# Patient Record
Sex: Female | Born: 1955 | Race: Black or African American | Hispanic: No | Marital: Single | Smoking: Never smoker
Health system: Southern US, Community
[De-identification: ages and names within clinical notes are randomized; demographics above are authoritative.]

## PROBLEM LIST (undated history)

## (undated) DIAGNOSIS — B009 Herpesviral infection, unspecified: Secondary | ICD-10-CM

## (undated) DIAGNOSIS — D649 Anemia, unspecified: Secondary | ICD-10-CM

## (undated) HISTORY — DX: Anemia, unspecified: D64.9

## (undated) HISTORY — PX: NECK SURGERY: SHX720

## (undated) HISTORY — DX: Herpesviral infection, unspecified: B00.9

---

## 1997-04-02 ENCOUNTER — Ambulatory Visit (HOSPITAL_COMMUNITY): Admission: RE | Admit: 1997-04-02 | Discharge: 1997-04-02 | Payer: Self-pay | Admitting: Family Medicine

## 1998-04-23 ENCOUNTER — Ambulatory Visit (HOSPITAL_COMMUNITY): Admission: RE | Admit: 1998-04-23 | Discharge: 1998-04-23 | Payer: Self-pay | Admitting: Family Medicine

## 1998-05-09 ENCOUNTER — Encounter: Payer: Self-pay | Admitting: Family Medicine

## 1998-05-09 ENCOUNTER — Ambulatory Visit (HOSPITAL_COMMUNITY): Admission: RE | Admit: 1998-05-09 | Discharge: 1998-05-09 | Payer: Self-pay | Admitting: Family Medicine

## 1999-02-06 ENCOUNTER — Other Ambulatory Visit: Admission: RE | Admit: 1999-02-06 | Discharge: 1999-02-06 | Payer: Self-pay | Admitting: Family Medicine

## 1999-02-18 ENCOUNTER — Ambulatory Visit (HOSPITAL_COMMUNITY): Admission: RE | Admit: 1999-02-18 | Discharge: 1999-02-18 | Payer: Self-pay | Admitting: Family Medicine

## 1999-02-18 ENCOUNTER — Encounter: Payer: Self-pay | Admitting: Family Medicine

## 1999-04-02 ENCOUNTER — Ambulatory Visit (HOSPITAL_COMMUNITY): Admission: RE | Admit: 1999-04-02 | Discharge: 1999-04-02 | Payer: Self-pay | Admitting: Orthopedic Surgery

## 1999-11-27 ENCOUNTER — Ambulatory Visit (HOSPITAL_COMMUNITY): Admission: RE | Admit: 1999-11-27 | Discharge: 1999-11-27 | Payer: Self-pay | Admitting: Gastroenterology

## 2000-01-26 ENCOUNTER — Other Ambulatory Visit: Admission: RE | Admit: 2000-01-26 | Discharge: 2000-01-26 | Payer: Self-pay | Admitting: Family Medicine

## 2000-02-19 ENCOUNTER — Encounter: Payer: Self-pay | Admitting: Family Medicine

## 2000-02-19 ENCOUNTER — Ambulatory Visit (HOSPITAL_COMMUNITY): Admission: RE | Admit: 2000-02-19 | Discharge: 2000-02-19 | Payer: Self-pay | Admitting: Family Medicine

## 2001-02-21 ENCOUNTER — Encounter: Payer: Self-pay | Admitting: Family Medicine

## 2001-02-21 ENCOUNTER — Ambulatory Visit (HOSPITAL_COMMUNITY): Admission: RE | Admit: 2001-02-21 | Discharge: 2001-02-21 | Payer: Self-pay | Admitting: Family Medicine

## 2001-03-21 ENCOUNTER — Other Ambulatory Visit: Admission: RE | Admit: 2001-03-21 | Discharge: 2001-03-21 | Payer: Self-pay | Admitting: Family Medicine

## 2001-09-05 ENCOUNTER — Encounter: Admission: RE | Admit: 2001-09-05 | Discharge: 2001-09-05 | Payer: Self-pay | Admitting: Family Medicine

## 2001-09-05 ENCOUNTER — Encounter: Payer: Self-pay | Admitting: Family Medicine

## 2002-02-01 ENCOUNTER — Other Ambulatory Visit: Admission: RE | Admit: 2002-02-01 | Discharge: 2002-02-01 | Payer: Self-pay | Admitting: Family Medicine

## 2002-02-28 ENCOUNTER — Encounter: Admission: RE | Admit: 2002-02-28 | Discharge: 2002-02-28 | Payer: Self-pay | Admitting: Family Medicine

## 2002-02-28 ENCOUNTER — Encounter: Payer: Self-pay | Admitting: Family Medicine

## 2005-10-22 ENCOUNTER — Encounter: Admission: RE | Admit: 2005-10-22 | Discharge: 2005-10-22 | Payer: Self-pay | Admitting: Family Medicine

## 2005-12-24 ENCOUNTER — Ambulatory Visit (HOSPITAL_COMMUNITY): Admission: RE | Admit: 2005-12-24 | Discharge: 2005-12-24 | Payer: Self-pay | Admitting: Gastroenterology

## 2006-01-28 ENCOUNTER — Other Ambulatory Visit: Admission: RE | Admit: 2006-01-28 | Discharge: 2006-01-28 | Payer: Self-pay | Admitting: Family Medicine

## 2006-06-30 ENCOUNTER — Encounter: Admission: RE | Admit: 2006-06-30 | Discharge: 2006-06-30 | Payer: Self-pay | Admitting: Family Medicine

## 2006-10-28 ENCOUNTER — Encounter: Admission: RE | Admit: 2006-10-28 | Discharge: 2006-10-28 | Payer: Self-pay | Admitting: Family Medicine

## 2007-03-03 ENCOUNTER — Other Ambulatory Visit: Admission: RE | Admit: 2007-03-03 | Discharge: 2007-03-03 | Payer: Self-pay | Admitting: Family Medicine

## 2008-01-05 ENCOUNTER — Encounter: Admission: RE | Admit: 2008-01-05 | Discharge: 2008-01-05 | Payer: Self-pay | Admitting: Family Medicine

## 2008-02-09 ENCOUNTER — Encounter: Admission: RE | Admit: 2008-02-09 | Discharge: 2008-02-09 | Payer: Self-pay | Admitting: Obstetrics and Gynecology

## 2008-03-05 ENCOUNTER — Encounter: Admission: RE | Admit: 2008-03-05 | Discharge: 2008-03-05 | Payer: Self-pay | Admitting: Obstetrics and Gynecology

## 2009-01-17 ENCOUNTER — Encounter: Admission: RE | Admit: 2009-01-17 | Discharge: 2009-01-17 | Payer: Self-pay | Admitting: Family Medicine

## 2009-01-31 ENCOUNTER — Ambulatory Visit (HOSPITAL_COMMUNITY): Admission: RE | Admit: 2009-01-31 | Discharge: 2009-02-01 | Payer: Self-pay | Admitting: Neurosurgery

## 2010-01-19 ENCOUNTER — Encounter: Admission: RE | Admit: 2010-01-19 | Discharge: 2010-01-19 | Payer: Self-pay | Admitting: Family Medicine

## 2010-03-09 ENCOUNTER — Encounter
Admission: RE | Admit: 2010-03-09 | Discharge: 2010-03-09 | Payer: Self-pay | Source: Home / Self Care | Attending: Neurosurgery | Admitting: Neurosurgery

## 2010-05-26 LAB — BASIC METABOLIC PANEL
CO2: 25 mEq/L (ref 19–32)
Chloride: 105 mEq/L (ref 96–112)
Creatinine, Ser: 0.7 mg/dL (ref 0.4–1.2)
GFR calc Af Amer: >60 mL/min (ref >60)
Sodium: 135 mEq/L (ref 135–145)

## 2010-05-26 LAB — CBC
Hemoglobin: 13.1 g/dL (ref 12.0–15.0)
MCHC: 34.5 g/dL (ref 30.0–36.0)
MCV: 90.3 fL (ref 78.0–100.0)
RBC: 4.21 MIL/uL (ref 3.87–5.11)

## 2010-07-03 ENCOUNTER — Ambulatory Visit (HOSPITAL_COMMUNITY): Admission: RE | Admit: 2010-07-03 | Payer: 59 | Source: Ambulatory Visit | Admitting: Obstetrics and Gynecology

## 2010-07-10 NOTE — Op Note (Signed)
NAMESHERRICA, NIEHAUS           ACCOUNT NO.:  [id number]   MEDICAL RECORD NO.:  [id number]          PATIENT TYPE:  AMB   LOCATION:  ENDO                         FACILITY:  [facility]   PHYSICIAN:  Anselmo Rod, M.D.  DATE OF BIRTH:  1955-03-09   DATE OF PROCEDURE:  12/24/2005  DATE OF DISCHARGE:                                 OPERATIVE REPORT   PROCEDURE PERFORMED:  Screening colonoscopy.   ENDOSCOPIST:  Anselmo Rod, M.D.   INSTRUMENT USED:  Olympus video colonoscope.   INDICATIONS FOR PROCEDURE:  A 55 year old African-American female undergoing  screening colonoscopy.  Rule out colonic polyps, masses, etc.   PREPROCEDURE PREPARATION:  Informed consent was procured from the patient.  The patient fasted for 4 hr prior to the procedure, and prepped with 20  Osmoprep pills the night before the procedure and 12 pills the morning of  the procedure.  The risks and benefits of the procedure, including a 10%  miss rate of cancer and polyps were discussed with the patient as well.   PREPROCEDURE PHYSICAL:  VITAL SIGNS:  The patient had stable vital signs.  NECK:  Supple.  CHEST:  Clear to auscultation.  HEART:  S1, S2 regular.  ABDOMEN:  Soft with normal bowel sounds.   DESCRIPTION OF PROCEDURE:  The patient was placed in the left lateral  decubitus position, sedated with 70 mcg of fentanyl and 5 mg of Versed given  intravenously in slow incremental doses.  Once the patient was adequately  sedated and maintained on low-flow oxygen and continuous cardiac monitoring,  the Olympus video colonoscope was advanced from the rectum to the cecum. The  appendiceal orifice and ileocecal valve were clearly visualized and  photographed.  The terminal ileum appeared normal without lesions.  No  masses, polyps, erosions, ulcerations or diverticula were seen. Retroflexion  of the rectum revealed no abnormalities.  The patient tolerated the  procedure well, without any complications.   IMPRESSION:  Normal colonoscopy up to terminal ileum.  No masses, polyps,  erosions, ulcerations or diverticula seen.   RECOMMENDATIONS:  Considering the patient's family history of colon cancer  in a first-degree relative, a repeat colonoscopy is recommended in the next  5 years.  If the patient develops any abnormal symptoms in the interim, in  that case she should contact the office immediately for further  recommendations. Outpatient follow-up as need arises in the future.      Anselmo Rod, M.D.  Electronically Signed     JNM/MEDQ  D:  12/24/2005  T:  12/24/2005  Job:  [id number]   cc:   Renaye Rakers, M.D.

## 2010-07-10 NOTE — Procedures (Signed)
[facility]  Patient:    Cristina Bailey, Cristina Bailey                  MRN: [id number] Proc. Date: 11/27/99 Adm. Date:  [id number] Disc. Date: [id number] Attending:  Charna Elizabeth CC:         Geraldo Pitter, M.D.   Procedure Report  DATE OF BIRTH:  1956-01-01  REFERRING PHYSICIAN:  Geraldo Pitter, M.D.  PROCEDURE PERFORMED:  Colonoscopy.  ENDOSCOPIST:  Nicanor Alcon, M.D.  INSTRUMENT USED:  Olympus video colonoscope.  INDICATION FOR PROCEDURE:  Personal history of rectal bleeding and a family history of colon cancer in a brother at age 16 with metastatic disease.  Rule out colonic polyps, masses, hemorrhoids, etc.  PREPROCEDURE PREPARATION:  Informed consent was procured from the patient. The patient was fasted for eight hours prior to the procedure and prepped with a bottle of Magnesium Citrate and a gallon of NuLYTELY the night prior to the procedure.  PREPROCEDURE PHYSICAL:  VITAL SIGNS:  The patient had stable vital signs.  NECK:  Supple.  CHEST:  Clear to auscultation.  HEART:  S1 and S2 regular.  ABDOMEN:  Soft with normal abdominal bowel sounds.  DESCRIPTION OF PROCEDURE:  The patient was placed in the left lateral decubitus position and sedated with Demerol and Versed.  Once the patient was adequately sedated with 50 mg of Demerol and 5 mg of Versed, the Olympus video colonoscope was advanced from the rectum to the cecum without difficulty. There was a small amount of rectal stool in the colon, but visualization was adequate.  No masses, polyps, ulcerations or diverticula was seen.  The patient tolerated the procedure well without complications.  IMPRESSION:  Normal colonoscopy.  RECOMMENDATIONS: 1. I suspect the patients bleeding may be secondary to    constipation and hard stool.  A high fiber diet has been    discussed with her and brochures have been given for education. 2. If her rectal bleeding continues, other  workup in the form of    an esophagogastroduodenoscopy and possible small-bowel study    may be required. 3. If her symptoms remain stable, repeat colorectal cancer    screening is recommended in the next five years unless she    develops any symptoms in the interim.   DD:  11/27/99 TD:  11/30/99 Job: [id number]

## 2010-08-24 IMAGING — US US PELVIS COMPLETE
1 series · 13 of 25 positions shown · non-contrast
Comparison: None.

CLINICAL DATA: Vaginal bleeding.  Uterine fibroids.

TRANSABDOMINAL AND TRANSVAGINAL ULTRASOUND OF PELVIS
TECHNIQUE: Both transabdominal and transvaginal ultrasound
examinations of the pelvis were performed including evaluation of
the uterus, ovaries, adnexal regions, and pelvic cul-de-sac.

[Series 1: us pelvis complete · 0.31mm/px · 13 of 69 slices shown]
[im 1/69]
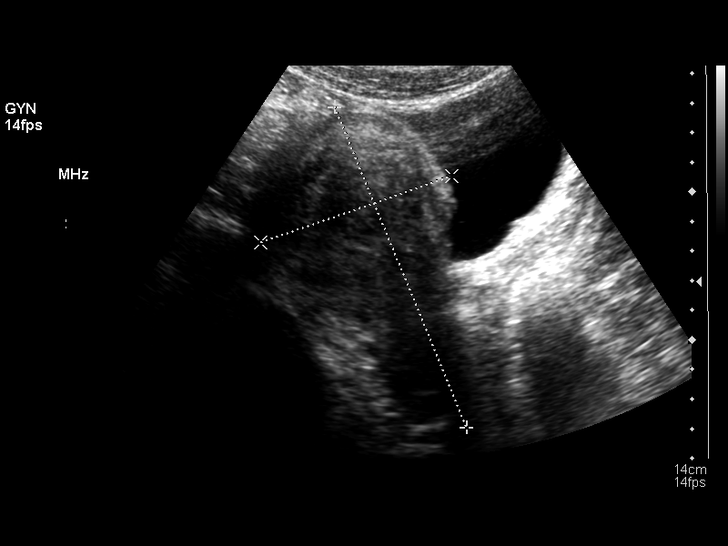
[im 6/69]
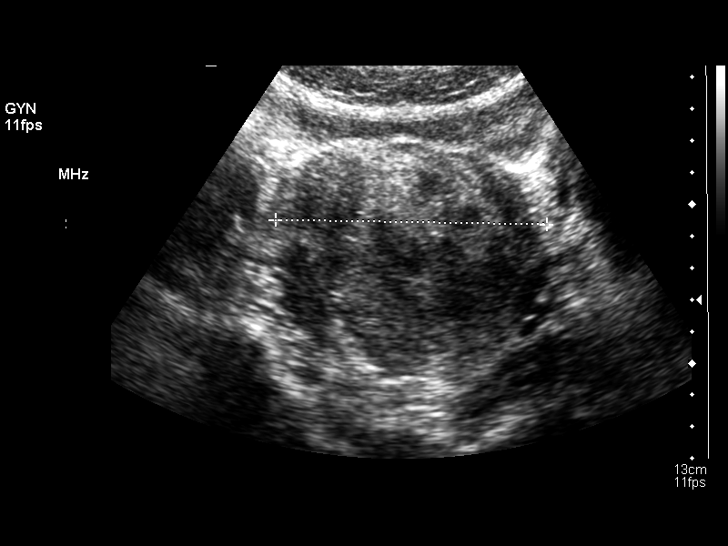
[im 12/69]
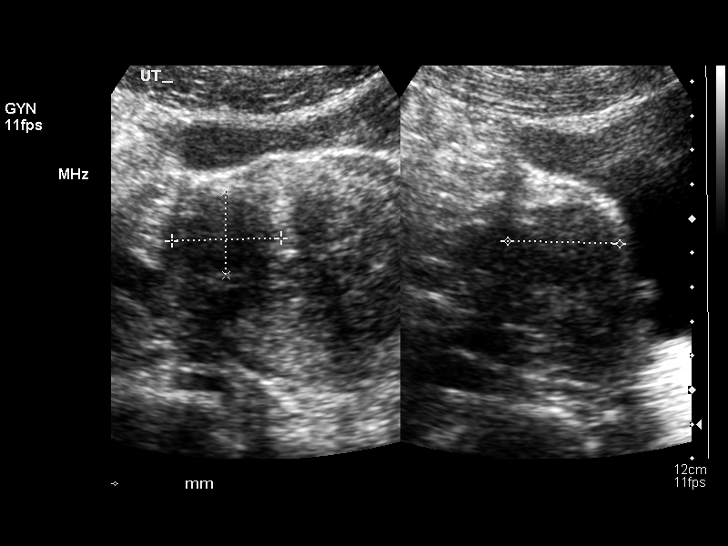
[im 18/69]
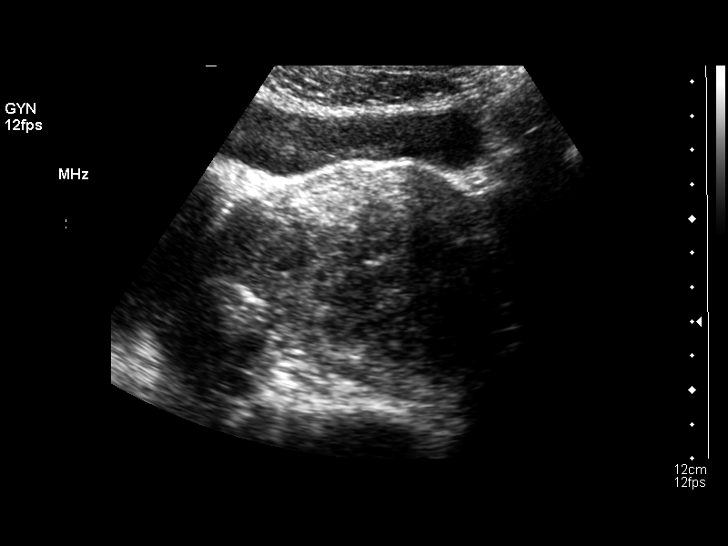
[im 23/69]
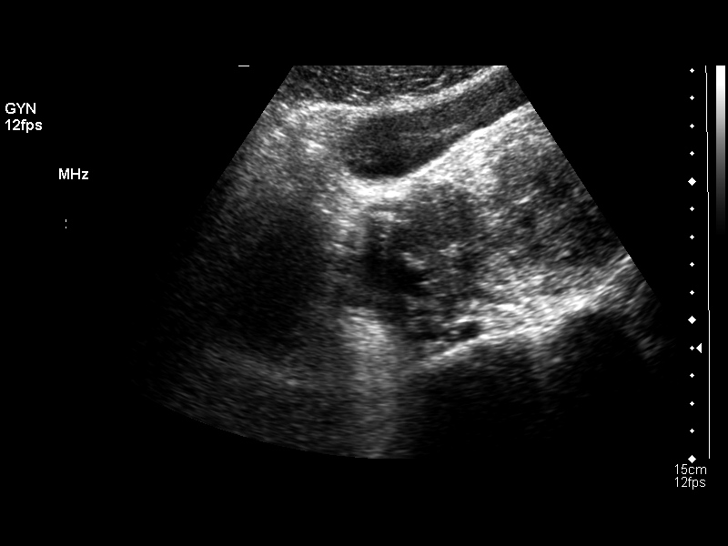
[im 29/69]
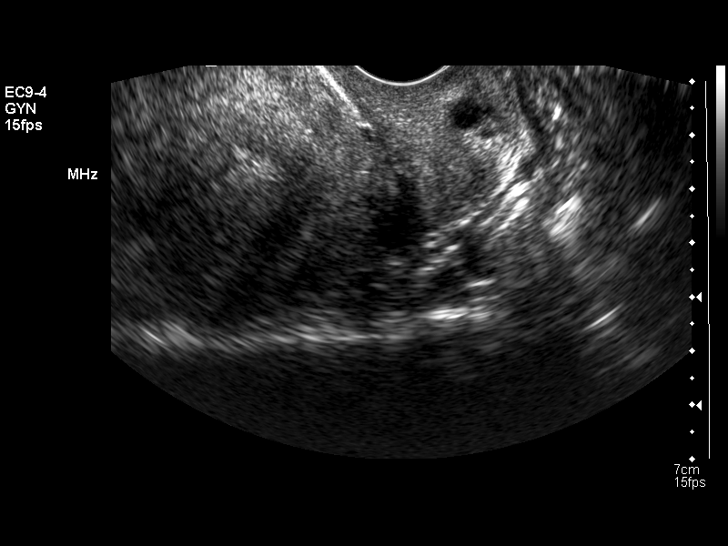
[im 35/69]
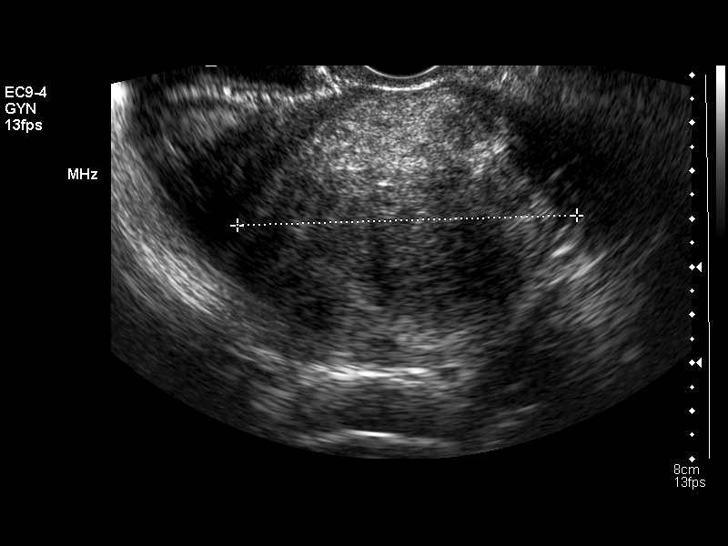
[im 40/69]
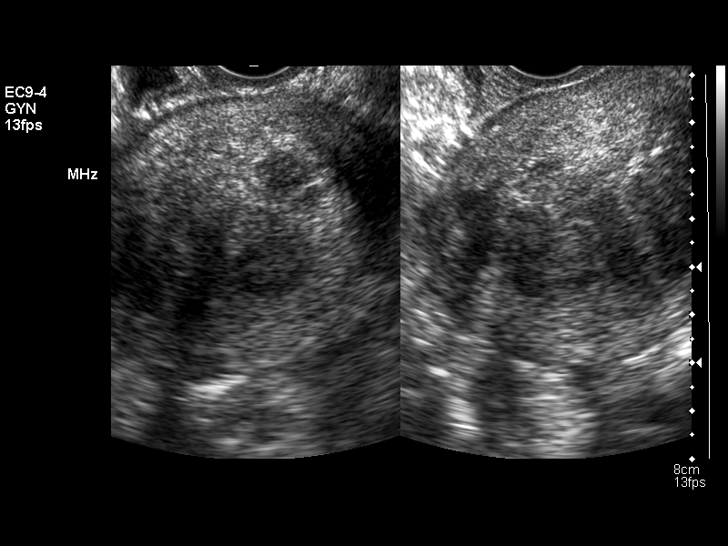
[im 46/69]
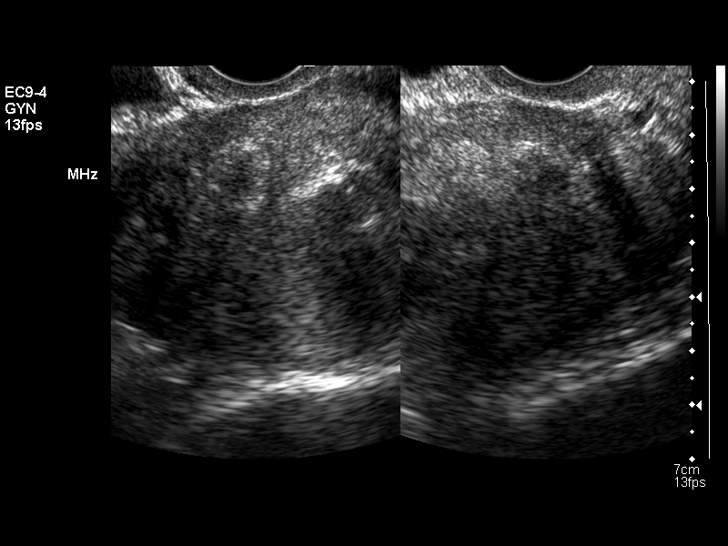
[im 52/69]
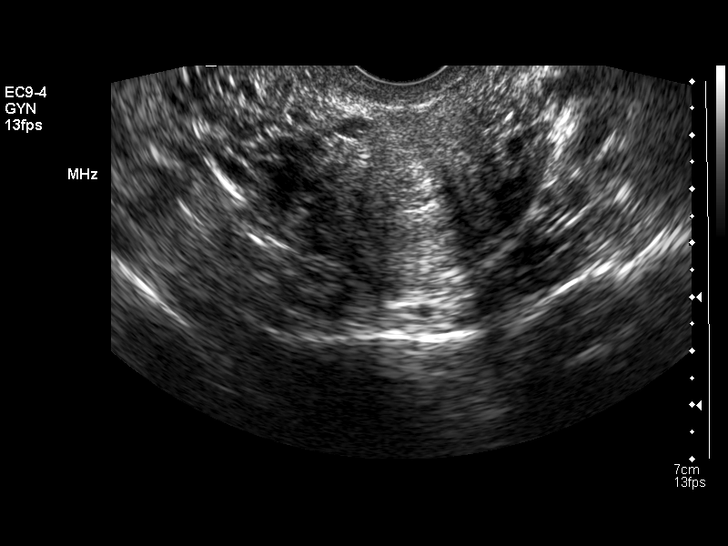
[im 57/69]
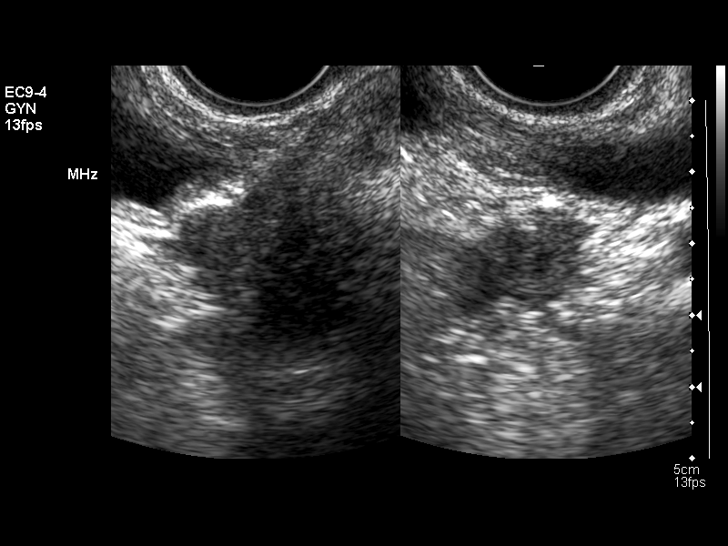
[im 63/69]
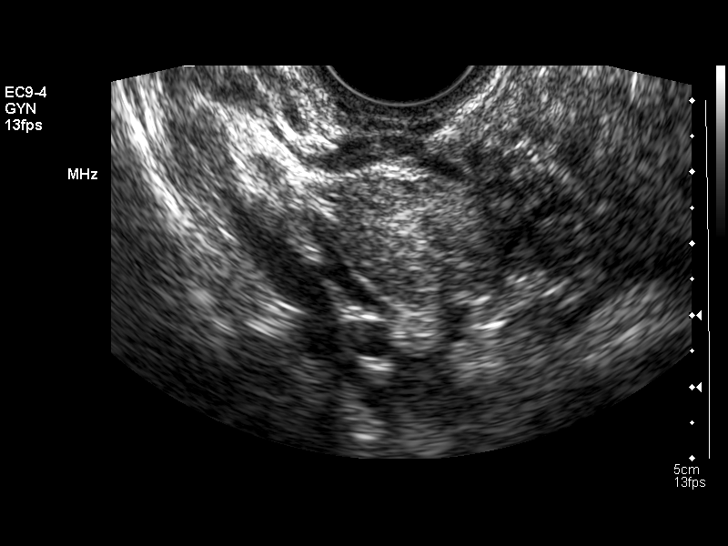
[im 69/69]
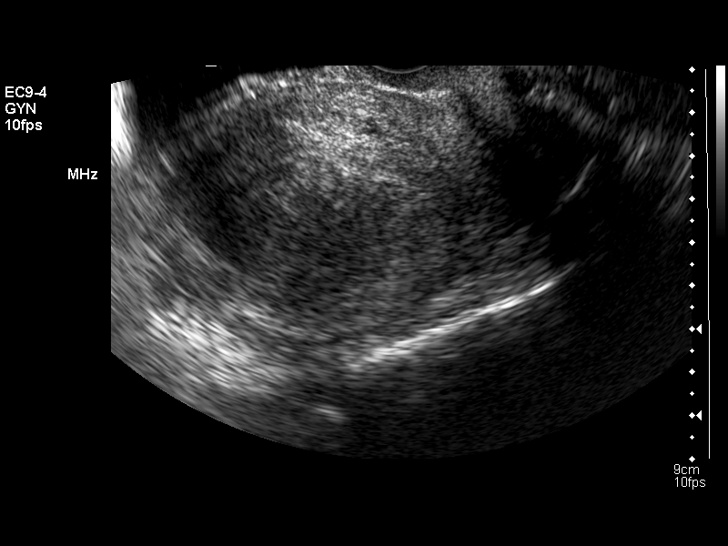

[13 of 25 positions shown; findings below may reference images not displayed]

FINDINGS: Enlarged uterus measuring enlarged uterus measuring
x 6.3 x 7.1 cm.  There are multiple fibroids.  The largest measures
3.4 cm located within the right lateral fundal region.  Other
fibroids  extend to the endometrial lining and may contribute to
the patient's vaginal bleeding.  Distortion of the endometrial
lining.  Endometrial lining thickness 5.4 mm.  No discrete
endometrial polyps or masses identified.  Nabothian cysts.

Nonvisualization of the left ovary.  The right ovary is also not
well visualized.  On the transvaginal aspect of the present exam,
echogenic structure measuring up to 2.3 cm seen in the right
adnexal region.  It is possible this represents the right ovary
although characteristics are not typical of a normal ovary.  If
further evaluation of the ovaries is clinically desired then MR
would be necessary for further delineation.

No free fluid.
IMPRESSION: Multiple uterine fibroids distort the endometrial lining
potentially contributing to patient's vaginal bleeding.

Poor delineation of the ovaries as discussed above.

## 2010-12-18 ENCOUNTER — Other Ambulatory Visit: Payer: Self-pay | Admitting: Family Medicine

## 2010-12-18 DIAGNOSIS — Z1231 Encounter for screening mammogram for malignant neoplasm of breast: Secondary | ICD-10-CM

## 2011-01-22 ENCOUNTER — Ambulatory Visit
Admission: RE | Admit: 2011-01-22 | Discharge: 2011-01-22 | Disposition: A | Discharge status: Home / Self Care | Payer: 59 | Source: Ambulatory Visit | Attending: Family Medicine | Admitting: Family Medicine

## 2011-01-22 DIAGNOSIS — Z1231 Encounter for screening mammogram for malignant neoplasm of breast: Secondary | ICD-10-CM

## 2012-01-10 ENCOUNTER — Other Ambulatory Visit: Payer: Self-pay | Admitting: Family Medicine

## 2012-01-10 DIAGNOSIS — Z1231 Encounter for screening mammogram for malignant neoplasm of breast: Secondary | ICD-10-CM

## 2012-02-14 ENCOUNTER — Ambulatory Visit
Admission: RE | Admit: 2012-02-14 | Discharge: 2012-02-14 | Disposition: A | Discharge status: Home / Self Care | Payer: BC Managed Care – PPO | Source: Ambulatory Visit | Attending: Family Medicine | Admitting: Family Medicine

## 2012-02-14 DIAGNOSIS — Z1231 Encounter for screening mammogram for malignant neoplasm of breast: Secondary | ICD-10-CM

## 2012-07-25 ENCOUNTER — Other Ambulatory Visit: Payer: Self-pay | Admitting: Family Medicine

## 2012-07-25 DIAGNOSIS — R109 Unspecified abdominal pain: Secondary | ICD-10-CM

## 2012-07-26 ENCOUNTER — Ambulatory Visit
Admission: RE | Admit: 2012-07-26 | Discharge: 2012-07-26 | Disposition: A | Discharge status: Home / Self Care | Payer: BC Managed Care – PPO | Source: Ambulatory Visit | Attending: Family Medicine | Admitting: Family Medicine

## 2012-07-26 DIAGNOSIS — R109 Unspecified abdominal pain: Secondary | ICD-10-CM

## 2013-01-08 ENCOUNTER — Other Ambulatory Visit: Payer: Self-pay

## 2013-01-08 DIAGNOSIS — Z1231 Encounter for screening mammogram for malignant neoplasm of breast: Secondary | ICD-10-CM

## 2013-02-19 ENCOUNTER — Ambulatory Visit
Admission: RE | Admit: 2013-02-19 | Discharge: 2013-02-19 | Disposition: A | Discharge status: Home / Self Care | Payer: No Typology Code available for payment source | Source: Ambulatory Visit

## 2013-02-19 DIAGNOSIS — Z1231 Encounter for screening mammogram for malignant neoplasm of breast: Secondary | ICD-10-CM

## 2014-03-05 ENCOUNTER — Other Ambulatory Visit: Payer: Self-pay

## 2014-03-05 DIAGNOSIS — Z1231 Encounter for screening mammogram for malignant neoplasm of breast: Secondary | ICD-10-CM

## 2014-03-12 ENCOUNTER — Ambulatory Visit
Admission: RE | Admit: 2014-03-12 | Discharge: 2014-03-12 | Disposition: A | Discharge status: Home / Self Care | Payer: 59 | Source: Ambulatory Visit

## 2014-03-12 DIAGNOSIS — Z1231 Encounter for screening mammogram for malignant neoplasm of breast: Secondary | ICD-10-CM

## 2015-03-18 ENCOUNTER — Other Ambulatory Visit: Payer: Self-pay

## 2015-03-18 DIAGNOSIS — Z1231 Encounter for screening mammogram for malignant neoplasm of breast: Secondary | ICD-10-CM

## 2015-04-08 ENCOUNTER — Ambulatory Visit
Admission: RE | Admit: 2015-04-08 | Discharge: 2015-04-08 | Disposition: A | Discharge status: Home / Self Care | Payer: 59 | Source: Ambulatory Visit

## 2015-04-08 DIAGNOSIS — Z1231 Encounter for screening mammogram for malignant neoplasm of breast: Secondary | ICD-10-CM

## 2016-03-22 ENCOUNTER — Ambulatory Visit
Admission: RE | Admit: 2016-03-22 | Discharge: 2016-03-22 | Disposition: A | Discharge status: Home / Self Care | Payer: 59 | Source: Ambulatory Visit | Attending: Family Medicine | Admitting: Family Medicine

## 2016-03-22 ENCOUNTER — Other Ambulatory Visit: Payer: Self-pay | Admitting: Family Medicine

## 2016-03-22 DIAGNOSIS — S42032B Displaced fracture of lateral end of left clavicle, initial encounter for open fracture: Secondary | ICD-10-CM

## 2017-03-04 ENCOUNTER — Telehealth: Payer: Self-pay | Admitting: Hematology and Oncology

## 2017-03-04 NOTE — Telephone Encounter (Signed)
Left message on voicemail for patient and called referring MD's office with D/T/Loc/Ph#

## 2017-03-22 ENCOUNTER — Inpatient Hospital Stay: Payer: 59 | Attending: Hematology and Oncology | Admitting: Hematology and Oncology

## 2017-03-22 ENCOUNTER — Encounter: Payer: Self-pay | Admitting: Hematology and Oncology

## 2017-03-22 ENCOUNTER — Telehealth: Payer: Self-pay | Admitting: Hematology and Oncology

## 2017-03-22 ENCOUNTER — Inpatient Hospital Stay: Payer: 59

## 2017-03-22 VITALS — BP 129/49 | HR 64 | Temp 98.5°F | Resp 17 | Ht 68.0 in | Wt 139.7 lb

## 2017-03-22 DIAGNOSIS — D709 Neutropenia, unspecified: Secondary | ICD-10-CM | POA: Insufficient documentation

## 2017-03-22 LAB — CBC WITH DIFFERENTIAL (CANCER CENTER ONLY)
Basophils Absolute: 0 10*3/uL (ref 0.0–0.1)
Basophils Relative: 1 %
EOS ABS: 0.1 10*3/uL (ref 0.0–0.5)
Eosinophils Relative: 3 %
HCT: 38 % (ref 34.8–46.6)
HEMOGLOBIN: 12.6 g/dL (ref 11.6–15.9)
LYMPHS ABS: 1.1 10*3/uL (ref 0.9–3.3)
LYMPHS PCT: 34 %
MCH: 30.3 pg (ref 25.1–34.0)
MCHC: 33 g/dL (ref 31.5–36.0)
MCV: 91.6 fL (ref 79.5–101.0)
MONOS PCT: 10 %
Monocytes Absolute: 0.3 10*3/uL (ref 0.1–0.9)
NEUTROS PCT: 52 %
Neutro Abs: 1.7 10*3/uL (ref 1.5–6.5)
Platelet Count: 205 10*3/uL (ref 145–400)
RBC: 4.15 MIL/uL (ref 3.70–5.45)
RDW: 13.2 % (ref 11.2–16.1)
WBC Count: 3.3 10*3/uL — ABNORMAL LOW (ref 3.9–10.3)

## 2017-03-22 LAB — CMP (CANCER CENTER ONLY)
ALT: 14 U/L (ref 0–55)
AST: 20 U/L (ref 5–34)
Albumin: 4.1 g/dL (ref 3.5–5.0)
Alkaline Phosphatase: 82 U/L (ref 40–150)
Anion gap: 7 (ref 3–11)
BUN: 14 mg/dL (ref 7–26)
CHLORIDE: 107 mmol/L (ref 98–109)
CO2: 27 mmol/L (ref 22–29)
CREATININE: 0.85 mg/dL (ref 0.60–1.10)
Calcium: 9.7 mg/dL (ref 8.4–10.4)
GFR, Est AFR Am: >60 mL/min (ref >60)
GFR, Estimated: >60 mL/min (ref >60)
Glucose, Bld: 82 mg/dL (ref 70–140)
Potassium: 3.8 mmol/L (ref 3.3–4.7)
Sodium: 141 mmol/L (ref 136–145)
Total Bilirubin: 0.8 mg/dL (ref 0.2–1.2)
Total Protein: 7.7 g/dL (ref 6.4–8.3)

## 2017-03-22 LAB — TECHNOLOGIST SMEAR REVIEW

## 2017-03-22 LAB — TYPE AND SCREEN
ABO/RH(D): O POS
ANTIBODY SCREEN: NEGATIVE

## 2017-03-22 LAB — APTT: aPTT: 32 seconds (ref 24–36)

## 2017-03-22 LAB — PROTIME-INR
INR: 0.99
Prothrombin Time: 13 seconds (ref 11.4–15.2)

## 2017-03-22 LAB — LACTATE DEHYDROGENASE: LDH: 146 U/L (ref 125–245)

## 2017-03-22 LAB — SEDIMENTATION RATE: SED RATE: 5 mm/h (ref 0–22)

## 2017-03-22 LAB — C-REACTIVE PROTEIN: CRP: <0.8 mg/dL (ref <1.0)

## 2017-03-22 LAB — FOLATE: FOLATE: 17.9 ng/mL (ref >5.9)

## 2017-03-22 LAB — ABO/RH: ABO/RH(D): O POS

## 2017-03-22 LAB — VITAMIN B12: Vitamin B-12: 1495 pg/mL — ABNORMAL HIGH (ref 180–914)

## 2017-03-22 NOTE — Telephone Encounter (Signed)
Gave avs and calendar for february °

## 2017-03-23 LAB — HIV ANTIBODY (ROUTINE TESTING W REFLEX): HIV SCREEN 4TH GENERATION: NONREACTIVE

## 2017-03-23 LAB — HEPATITIS B SURFACE ANTIGEN: Hepatitis B Surface Ag: NEGATIVE

## 2017-03-23 LAB — HEPATITIS B SURFACE ANTIBODY,QUALITATIVE: Hep B S Ab: REACTIVE

## 2017-03-23 LAB — EPSTEIN-BARR VIRUS VCA, IGG

## 2017-03-23 LAB — EPSTEIN-BARR VIRUS NUCLEAR ANTIGEN ANTIBODY, IGG

## 2017-03-23 LAB — HEPATITIS B CORE ANTIBODY, TOTAL: Hep B Core Total Ab: POSITIVE — AB

## 2017-03-23 LAB — HEPATITIS C ANTIBODY: HCV Ab: <0.1 s/co ratio (ref 0.0–0.9)

## 2017-03-23 LAB — EPSTEIN-BARR VIRUS EARLY D ANTIGEN ANTIBODY, IGG

## 2017-03-23 LAB — EPSTEIN-BARR VIRUS VCA, IGM: EBV VCA IgM: <36 U/mL (ref 0.0–35.9)

## 2017-03-24 LAB — COPPER, SERUM: Copper: 107 ug/dL (ref 72–166)

## 2017-03-25 LAB — METHYLMALONIC ACID, SERUM: METHYLMALONIC ACID, QUANTITATIVE: 120 nmol/L (ref 0–378)

## 2017-03-29 ENCOUNTER — Inpatient Hospital Stay: Payer: 59 | Attending: Hematology and Oncology | Admitting: Hematology and Oncology

## 2017-03-29 ENCOUNTER — Encounter: Payer: Self-pay | Admitting: Hematology and Oncology

## 2017-03-29 ENCOUNTER — Telehealth: Payer: Self-pay | Admitting: Hematology and Oncology

## 2017-03-29 ENCOUNTER — Other Ambulatory Visit: Payer: Self-pay

## 2017-03-29 ENCOUNTER — Inpatient Hospital Stay: Payer: 59

## 2017-03-29 VITALS — BP 132/70 | HR 79 | Temp 101.4°F | Resp 17 | Ht 68.0 in | Wt 141.3 lb

## 2017-03-29 DIAGNOSIS — R768 Other specified abnormal immunological findings in serum: Secondary | ICD-10-CM

## 2017-03-29 DIAGNOSIS — R05 Cough: Secondary | ICD-10-CM | POA: Diagnosis not present

## 2017-03-29 DIAGNOSIS — D709 Neutropenia, unspecified: Secondary | ICD-10-CM

## 2017-03-29 DIAGNOSIS — R509 Fever, unspecified: Secondary | ICD-10-CM | POA: Diagnosis not present

## 2017-03-29 DIAGNOSIS — R059 Cough, unspecified: Secondary | ICD-10-CM

## 2017-03-29 DIAGNOSIS — R52 Pain, unspecified: Secondary | ICD-10-CM

## 2017-03-29 DIAGNOSIS — M2550 Pain in unspecified joint: Secondary | ICD-10-CM | POA: Diagnosis not present

## 2017-03-29 LAB — CBC WITH DIFFERENTIAL (CANCER CENTER ONLY)
BASOS ABS: 0 10*3/uL (ref 0.0–0.1)
BASOS PCT: 0 %
EOS ABS: 0 10*3/uL (ref 0.0–0.5)
EOS PCT: 1 %
HEMATOCRIT: 37.6 % (ref 34.8–46.6)
Hemoglobin: 12.4 g/dL (ref 11.6–15.9)
Lymphocytes Relative: 9 %
Lymphs Abs: 0.3 10*3/uL — ABNORMAL LOW (ref 0.9–3.3)
MCH: 30.1 pg (ref 25.1–34.0)
MCHC: 32.9 g/dL (ref 31.5–36.0)
MCV: 91.7 fL (ref 79.5–101.0)
MONO ABS: 0.7 10*3/uL (ref 0.1–0.9)
MONOS PCT: 20 %
NEUTROS ABS: 2.6 10*3/uL (ref 1.5–6.5)
Neutrophils Relative %: 70 %
PLATELETS: 190 10*3/uL (ref 145–400)
RBC: 4.1 MIL/uL (ref 3.70–5.45)
RDW: 13 % (ref 11.2–14.5)
WBC Count: 3.6 10*3/uL — ABNORMAL LOW (ref 3.9–10.3)

## 2017-03-29 NOTE — Telephone Encounter (Signed)
Gave patient avs report and appointments for March  °

## 2017-03-30 LAB — HEPATITIS B DNA, ULTRAQUANTITATIVE, PCR
HBV DNA SERPL PCR-ACNC: NOT DETECTED [IU]/mL
HBV DNA SERPL PCR-LOG IU: UNDETERMINED {Log_IU}/mL

## 2017-04-10 DIAGNOSIS — D709 Neutropenia, unspecified: Secondary | ICD-10-CM | POA: Insufficient documentation

## 2017-04-10 NOTE — Progress Notes (Signed)
[place] [facility] New Visit:  Assessment: Neutropenia [facility]) 62 y.o. female with mild neutropenia of unclear duration, but at least since July of last year.  Likely gradual decline that started as early is April 2018.  No specific systemic symptoms to suggest persistent infection, nutritional deficiency, or malignancy at this time.  Will conduct evaluation starting with repeat blood work as outlined below.  Plan: -Labs as outlined below. -Return to clinic in 1 week to review the findings. - Consult genetic counselor due to family history of early onset colon cancer and breast cancer in the same blood line.  Voice recognition software was used and creation of this note. Despite my best effort at editing the text, some misspelling/errors may have occurred.  Orders Placed This Encounter  Procedures  . CBC with Differential (Cancer Center Only)    Standing Status:   Future    Number of Occurrences:   1    Standing Expiration Date:   03/22/2018  . CMP ([place] only)    Standing Status:   Future    Number of Occurrences:   1    Standing Expiration Date:   03/22/2018  . Sedimentation rate    Standing Status:   Future    Number of Occurrences:   1    Standing Expiration Date:   03/22/2018  . Lactate dehydrogenase (LDH)    Standing Status:   Future    Number of Occurrences:   1    Standing Expiration Date:   03/22/2018  . Technologist smear review    Standing Status:   Future    Number of Occurrences:   1    Standing Expiration Date:   03/22/2018  . Protime-INR    Standing Status:   Future    Number of Occurrences:   1    Standing Expiration Date:   03/22/2018  . C-reactive protein    Standing Status:   Future    Number of Occurrences:   1    Standing Expiration Date:   03/22/2018  . Hepatitis C antibody    Standing Status:   Future    Number of Occurrences:   1    Standing Expiration Date:   03/22/2018  . Hepatitis B core antibody, total    Standing Status:    Future    Number of Occurrences:   1    Standing Expiration Date:   03/22/2018  . Hepatitis B surface antibody    Standing Status:   Future    Number of Occurrences:   1    Standing Expiration Date:   03/22/2018  . Hepatitis B surface antigen    Standing Status:   Future    Number of Occurrences:   1    Standing Expiration Date:   03/22/2018  . HIV antibody (with reflex)    Standing Status:   Future    Number of Occurrences:   1    Standing Expiration Date:   03/22/2018  . Epstein-Barr virus nuclear antigen antibody, IgG    Standing Status:   Future    Number of Occurrences:   1    Standing Expiration Date:   03/22/2018  . Epstein-Barr virus VCA, IgG    Standing Status:   Future    Number of Occurrences:   1    Standing Expiration Date:   03/22/2018  . Epstein-Barr virus VCA, IgM    Standing Status:   Future    Number of Occurrences:  1    Standing Expiration Date:   03/22/2018  . Epstein-Barr virus early D antigen antibody, IgG    Standing Status:   Future    Number of Occurrences:   1    Standing Expiration Date:   03/22/2018  . APTT    Standing Status:   Future    Number of Occurrences:   1    Standing Expiration Date:   03/22/2018  . Methylmalonic acid, serum    Standing Status:   Future    Number of Occurrences:   1    Standing Expiration Date:   03/22/2018  . Vitamin B12    Standing Status:   Future    Number of Occurrences:   1    Standing Expiration Date:   03/22/2018  . Folate, Serum    Standing Status:   Future    Number of Occurrences:   1    Standing Expiration Date:   03/22/2018  . Copper, serum    Standing Status:   Future    Number of Occurrences:   1    Standing Expiration Date:   03/22/2018  . Type and screen    Standing Status:   Future    Number of Occurrences:   1    Standing Expiration Date:   03/22/2018    All questions were answered.  . The patient knows to call the clinic with any problems, questions or concerns.  This note was electronically  signed.    History of Presenting Illness Cristina Bailey 62 y.o. presenting to the [facility] for low white blood cell count, referred by Dr Lucianne Lei.  Patient's past medical history significant for hypertension, depression/anxiety.  Most recent mammogram in February 2017, last colonoscopy in 2014, both studies are due for repeat.  Patient's family history is significant for brother with colon cancer at age of 68, mother with breast cancer at age of 74, father with history of congestive heart failure and deep vein thrombosis.  Patient is a non-smoker and uses minimal amounts of alcohol socially.  Regarding her symptoms, patient reports not eating well/enough, overall, she reports significant number of changes since beginning of her menopause in particular, she complains of mental fog in the morning when she gets up lasting for anywhere between about 2 hours.  Past year has been difficult for her and April through July she had a lot of personal stress taking care of her elderly mother.  November through December patient had an upper respiratory tract infection requiring treatment with azithromycin with postinfectious cough which has now resolved over the next several weeks.  Occasional diarrhea without hematochezia or melena.  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  No recent exacerbations.  Patient denies current fevers, chills, night sweats.  No shortness of breath, chest pain, or cough.  No abdominal pain, early satiety, nausea, vomiting, dysuria, hematuria, or vaginal drainage.  No rashes, swollen lymph nodes, or neurological deficits.  Oncological/hematological History: --Labs, 01/27/09: WBC 3.9,              Hgb 13.1, MCV 90.3, MCH 34.5, Plt 261  --Labs, 04/19/16: WBC 3.0, ANC 1.5, ALC 1.2, Mono 0.3, Hgb 14.3, MCV 90.3, MCH 30.0, Plt 212; --Labs, 06/07/16: WBC 4.1, ANC 1.7, ALC 1.8, Mono 0.4, Hgb 13.0, MCV 91.4, MCH 30.2, Plt 250; --Labs, 08/24/16: WBC 3.3, ANC  1.3, ALC 1.5, Mono 0.4, Hgb 13.0, MCV 91.8, MCH 30.3, Plt 220; --Labs, 03/02/17: WBC  2.7, ANC 1.1, ALC 1.2, Mono 0.3, Hgb 12.3, MCV 90.2, MCH 30.1, Plt 221;  Medical History: Past Medical History:  Diagnosis Date  . Anemia     Surgical History: Past Surgical History:  Procedure Laterality Date  . NECK SURGERY      Family History: Family History  Problem Relation Age of Onset  . Cancer Mother   . Hypertension Mother   . Cancer Brother     Social History: Social History   Socioeconomic History  . Marital status: Single    Spouse name: Not on file  . Number of children: Not on file  . Years of education: Not on file  . Highest education level: Not on file  Social Needs  . Financial resource strain: Not on file  . Food insecurity - worry: Not on file  . Food insecurity - inability: Not on file  . Transportation needs - medical: Not on file  . Transportation needs - non-medical: Not on file  Occupational History  . Not on file  Tobacco Use  . Smoking status: Never Smoker  . Smokeless tobacco: Never Used  Substance and Sexual Activity  . Alcohol use: No    Frequency: Never  . Drug use: No  . Sexual activity: Not on file  Other Topics Concern  . Not on file  Social History Narrative  . Not on file    Allergies: Allergies  Allergen Reactions  . Sulfa Antibiotics     Medications:  Current Outpatient Medications  Medication Sig Dispense Refill  . sertraline (ZOLOFT) 100 MG tablet Take 500 mg by mouth daily.     No current facility-administered medications for this visit.     Review of Systems: Review of Systems  Constitutional: Positive for appetite change.  Gastrointestinal: Positive for diarrhea. Negative for blood in stool.  All other systems reviewed and are negative.    PHYSICAL EXAMINATION Blood pressure (!) 129/49, pulse 64, temperature 98.5 F (36.9 C), temperature source Oral, resp. rate 17, height 5' 8" (1.727 m), weight 139 lb 11.2 oz  (63.4 kg), SpO2 100 %.  ECOG PERFORMANCE STATUS: 1 - Symptomatic but completely ambulatory  Physical Exam  Constitutional: She is oriented to person, place, and time and well-developed, well-nourished, and in no distress. No distress.  HENT:  Head: Normocephalic and atraumatic.  Mouth/Throat: Oropharynx is clear and moist. No oropharyngeal exudate.  Eyes: Conjunctivae and EOM are normal. Pupils are equal, round, and reactive to light. No scleral icterus.  Neck: No thyromegaly present.  Cardiovascular: Normal rate, regular rhythm and normal heart sounds.  No murmur heard. Pulmonary/Chest: Effort normal and breath sounds normal. No respiratory distress. She has no wheezes. She has no rales.  Abdominal: Soft. Bowel sounds are normal. She exhibits no distension and no mass. There is no tenderness. There is no guarding.  Musculoskeletal: She exhibits no edema.  Lymphadenopathy:    She has no cervical adenopathy.  Neurological: She is alert and oriented to person, place, and time. She has normal reflexes. No cranial nerve deficit.  Skin: Skin is warm and dry. No rash noted. She is not diaphoretic. No erythema. No pallor.     LABORATORY DATA: I have personally reviewed the data as listed: Appointment on 03/22/2017  Component Date Value Ref Range Status  . ABO/RH(D) 03/22/2017 O POS   Final  . Antibody Screen 03/22/2017 NEG   Final  . Sample Expiration 03/22/2017    Final  Value:03/25/2017 Performed at [facility] [place], [place] [address] [place], [place] [address]   . Copper 03/22/2017 107  72 - 166 ug/dL Final   Comment: (NOTE) This test was developed and its performance characteristics determined by [facility]. It has not been cleared or approved by the Food and Drug Administration.                                Detection Limit = 5 Performed At: [facility] [place], [place] [id number] Rush Farmer MD [id number] Performed at  [facility], [place] [address] [place], [place] [address]   . Folate 03/22/2017 17.9  >5.9 ng/mL Final   Performed at [facility], [place] [address], [place], [place] [address]  . Vitamin B-12 03/22/2017 1,495* 180 - 914 pg/mL Final   Comment: (NOTE) This assay is not validated for testing neonatal or myeloproliferative syndrome specimens for Vitamin B12 levels. Performed at [facility], [place] [address] [place], [place] [address]   . Methylmalonic Acid, Quantitative 03/22/2017 120  0 - 378 nmol/L Final  . Disclaimer: 03/22/2017 Comment   Final   Comment: (NOTE) This test was developed and its performance characteristics determined by [facility]. It has not been cleared or approved by the Food and Drug Administration. Performed At: [facility] [place], [place] [id number] Rush Farmer MD [id number] Performed at [facility], [place] [address], [place], [place] [address]   . aPTT 03/22/2017 32  24 - 36 seconds Final   Performed at [facility], [place] [address], [place], [place] [address]  . EBV Early Antigen Ab, IgG 03/22/2017 >150.0* 0.0 - 8.9 U/mL Final   Comment: (NOTE)                                 Negative        < 9.0                                 Equivocal  9.0 - 10.9                                 Positive        >10.9 Performed At: [facility] [place], [place] [id number] Rush Farmer MD [id number] Performed at [facility], [place] [address], [place], [place] [address]   . EBV VCA IgM 03/22/2017 <36.0  0.0 - 35.9 U/mL Final   Comment: (NOTE)                                 Negative        <36.0                                 Equivocal 36.0 - 43.9                                 Positive        >43.9 Performed At: [facility] [place], [place] [id number] Rush Farmer MD  [id number] Performed at [facility], [place] [address], [place], [place] [address]   . EBV VCA IgG 03/22/2017 >600.0* 0.0 - 17.9 U/mL Final   Comment: (NOTE)                                 Negative        <18.0                                 Equivocal 18.0 - 21.9                                 Positive        >21.9 Performed At: [facility] [place], [place] [id number] Rush Farmer MD [id number] Performed at [facility], [place] [address] [place], [place] [address]   . EBV NA IgG 03/22/2017 >600.0* 0.0 - 17.9 U/mL Final   Comment: (NOTE)                                 Negative        <18.0                                 Equivocal 18.0 - 21.9                                 Positive        >21.9 Performed At: [facility] [place], [place] [id number] Rush Farmer MD [id number] Performed at [facility], [place] [address] [place], [place] [address]   . HIV Screen 4th Generation wRfx 03/22/2017 Non Reactive  Non Reactive Final   Comment: (NOTE) Performed At: [facility] [place], [place] [id number] Rush Farmer MD [id number] Performed at [facility], [place] [address], [place], [place] [address]   . Hepatitis B Surface Ag 03/22/2017 Negative  Negative Final   Comment: (NOTE) Performed At: [facility] [place], [place] [id number] Rush Farmer MD IW:[id number] Performed at [facility], [place] [address], [place], [place] [address]   . Hep B S Ab 03/22/2017 Reactive   Final   Comment: (NOTE)              Non Reactive: Inconsistent with immunity,                            less than 10 mIU/mL              Reactive:     Consistent with immunity,                            greater than 9.9 mIU/mL Performed At: [facility] [place], [place] [id number] Rush Farmer MD [id number] Performed at [facility], [place] [address], [place], [place] [address]   . Hep B  Core Total Ab 03/22/2017 Positive* Negative Final   Comment: (NOTE) Performed At: [facility] [place], [place] [id number] Rush Farmer MD [id number] Performed at [facility], [place] [address] [place], [place] [address]   . HCV Ab 03/22/2017 <0.1  0.0 - 0.9 s/co ratio Final   Comment: (NOTE)                                  Negative:     < 0.8                             Indeterminate: 0.8 - 0.9                                  Positive:     > 0.9 The CDC recommends that a positive HCV antibody result be followed up with a HCV Nucleic Acid Amplification test (751025). Performed At: [facility] [place], [place] [id number] Rush Farmer MD [id number] Performed at [facility], [place] [address], [place], [place] [address]   . CRP 03/22/2017 <0.8  <1.0 mg/dL Final   Performed at [place] [address] [place], [place] [address]  . Prothrombin Time 03/22/2017 13.0  11.4 - 15.2 seconds Final  . INR 03/22/2017 0.99   Final   Performed at [facility], [place] [address] [place], [place] [address]  . Tech Review 03/22/2017 MANUAL DIFF CONSISTENT WITH AUTO DIFF   Final   Comment:  RBC MORPHOLOGY NORMAL  PLT COUNT ADEQUATE AND  MORPHOLOGY NORMAL Performed at [facility], [place] [address] [place], [place] [address]   . LDH 03/22/2017 146  125 - 245 U/L Final   Performed at [facility], [place] [address], [place], [place] [address]  . Sed Rate 03/22/2017 5  0 - 22 mm/hr Final   Performed at [facility], [place] [address], [place], [place] [address]  . Sodium 03/22/2017 141  136 - 145 mmol/L Final  . Potassium 03/22/2017 3.8  3.3 -  4.7 mmol/L Final  . Chloride 03/22/2017 107  98 - 109 mmol/L Final  . CO2 03/22/2017 27  22 - 29 mmol/L Final  . Glucose, Bld 03/22/2017 82  70 - 140 mg/dL Final  . BUN 03/22/2017 14  7 - 26 mg/dL Final  . Creatinine 03/22/2017 0.85  0.60 - 1.10 mg/dL Final  . Calcium 03/22/2017 9.7  8.4 - 10.4 mg/dL Final  . Total Protein 03/22/2017 7.7  6.4 - 8.3 g/dL Final  . Albumin 03/22/2017 4.1  3.5 - 5.0 g/dL Final  . AST 03/22/2017 20  5 - 34 U/L Final  . ALT 03/22/2017 14  0 - 55 U/L Final  . Alkaline Phosphatase 03/22/2017 82  40 - 150 U/L Final  . Total Bilirubin 03/22/2017 0.8  0.2 - 1.2 mg/dL Final  . GFR, Est Non Af Am 03/22/2017 >60  >60 mL/min Final  . GFR, Est AFR Am 03/22/2017 >60  >60 mL/min Final   Comment: (NOTE) The eGFR has been calculated using the CKD EPI equation. This calculation has not been validated in all clinical situations. eGFR's persistently <60 mL/min signify possible Chronic Kidney Disease.   Georgiann Hahn gap 03/22/2017  7  3 - 11 Final   Performed at [facility], [place] [address] [place], [place] [address]  . WBC Count 03/22/2017 3.3* 3.9 - 10.3 K/uL Final  . RBC 03/22/2017 4.15  3.70 - 5.45 MIL/uL Final  . Hemoglobin 03/22/2017 12.6  11.6 - 15.9 g/dL Final  . HCT 03/22/2017 38.0  34.8 - 46.6 % Final  . MCV 03/22/2017 91.6  79.5 - 101.0 fL Final  . MCH 03/22/2017 30.3  25.1 - 34.0 pg Final  . MCHC 03/22/2017 33.0  31.5 - 36.0 g/dL Final  . RDW 03/22/2017 13.2  11.2 - 16.1 % Final  . Platelet Count 03/22/2017 205  145 - 400 K/uL Final  . Neutrophils Relative % 03/22/2017 52  % Final  . Neutro Abs 03/22/2017 1.7  1.5 - 6.5 K/uL Final  . Lymphocytes Relative 03/22/2017 34  % Final  . Lymphs Abs 03/22/2017 1.1  0.9 - 3.3 K/uL Final  . Monocytes Relative 03/22/2017 10  % Final  . Monocytes Absolute 03/22/2017 0.3  0.1 - 0.9 K/uL Final  . Eosinophils Relative 03/22/2017 3  % Final  . Eosinophils Absolute 03/22/2017 0.1  0.0 - 0.5 K/uL Final   . Basophils Relative 03/22/2017 1  % Final  . Basophils Absolute 03/22/2017 0.0  0.0 - 0.1 K/uL Final   Performed at [facility], [place] [address] [place], [place] [address]  . ABO/RH(D) 03/22/2017    Final                   Value:O POS Performed at [facility], [place] [address] [place], [place] [address]          Ardath Sax, MD

## 2017-04-10 NOTE — Assessment & Plan Note (Signed)
62 y.o. female with mild neutropenia of unclear duration, but at least since July of last year.  Likely gradual decline that started as early is April 2018.  No specific systemic symptoms to suggest persistent infection, nutritional deficiency, or malignancy at this time.  Will conduct evaluation starting with repeat blood work as outlined below.  Plan: -Labs as outlined below. -Return to clinic in 1 week to review the findings. - Consult genetic counselor due to family history of early onset colon cancer and breast cancer in the same blood line.

## 2017-04-24 NOTE — Assessment & Plan Note (Signed)
62 y.o. female with mild neutropenia of unclear duration, but at least since July of last year.  Likely gradual decline that started as early is April 2018.  No specific systemic symptoms to suggest persistent infection, nutritional deficiency, or malignancy at this time.  Repeat blood work demonstrates improved white blood cell counts with resolution of neutropenia with absolute neutrophil count 1.7 today.  No new abnormalities in the white blood cell count, hemoglobin, or platelet counts.  Lab work is significant for either previous exposure or recent reactivation of EBV based on the positivity of the CA, NA, and early antigen immunoglobulin G values.  Of interest, patient does have positive antibodies for the surface and capsid proteins over the hepatitis B, but negative antigen for the surface of the hepatitis B.  This is consistent with either previous exposure to the hepatitis or with previous immunization.  Patient also has a fever today with muscle aches joint pains, and cough consistent with upper respiratory tract infection.  As we are entering flu season, additional testing will be necessary.  Plan: -Additional lab work as outlined below. -Return to clinic in 1 month for continued hematological monitoring. - Consult genetic counselor due to family history of early onset colon cancer and breast cancer in the same blood line.

## 2017-04-24 NOTE — Progress Notes (Signed)
[facility] Follow-up Visit:  Assessment: Neutropenia [facility]) 62 y.o. female with mild neutropenia of unclear duration, but at least since July of last year.  Likely gradual decline that started as early is April 2018.  No specific systemic symptoms to suggest persistent infection, nutritional deficiency, or malignancy at this time.  Repeat blood work demonstrates improved white blood cell counts with resolution of neutropenia with absolute neutrophil count 1.7 today.  No new abnormalities in the white blood cell count, hemoglobin, or platelet counts.  Lab work is significant for either previous exposure or recent reactivation of EBV based on the positivity of the CA, NA, and early antigen immunoglobulin G values.  Of interest, patient does have positive antibodies for the surface and capsid proteins over the hepatitis B, but negative antigen for the surface of the hepatitis B.  This is consistent with either previous exposure to the hepatitis or with previous immunization.  Patient also has a fever today with muscle aches joint pains, and cough consistent with upper respiratory tract infection.  As we are entering flu season, additional testing will be necessary.  Plan: -Additional lab work as outlined below. -Return to clinic in 1 month for continued hematological monitoring. - Consult genetic counselor due to family history of early onset colon cancer and breast cancer in the same blood line.  Voice recognition software was used and creation of this note. Despite my best effort at editing the text, some misspelling/errors may have occurred.  Orders Placed This Encounter  Procedures  . Influenza panel by PCR (type A & B)    Standing Status:   Future    Standing Expiration Date:   03/29/2018  . Hepatitis B DNA, ultraquantitative, PCR    Standing Status:   Future    Number of Occurrences:   1    Standing Expiration Date:   03/29/2018  . CBC with Differential (Cancer Center  Only)    Standing Status:   Future    Number of Occurrences:   1    Standing Expiration Date:   03/29/2018    Cancer Staging No matching staging information was found for the patient.  All questions were answered.  . The patient knows to call the clinic with any problems, questions or concerns.  This note was electronically signed.    History of Presenting Illness Cristina Bailey is a 62 y.o. female followed in the [facility] for low white blood cell count, referred by Dr Lucianne Lei.  Patient's past medical history significant for hypertension, depression/anxiety.  Most recent mammogram in February 2017, last colonoscopy in 2014, both studies are due for repeat.  Patient's family history is significant for brother with colon cancer at age of 38, mother with breast cancer at age of 56, father with history of congestive heart failure and deep vein thrombosis.  Patient is a non-smoker and uses minimal amounts of alcohol socially.  Regarding her symptoms, patient reports not eating well/enough, overall, she reports significant number of changes since beginning of her menopause in particular, she complains of mental fog in the morning when she gets up lasting for anywhere between about 2 hours.  Past year has been difficult for her and April through July she had a lot of personal stress taking care of her elderly mother.  November through December patient had an upper respiratory tract infection requiring treatment with azithromycin with postinfectious cough which has now resolved over the next several weeks.  Occasional diarrhea without hematochezia or  melena.  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  No recent exacerbations.  Patient denies current fevers, chills, night sweats.  No shortness of breath, chest pain, or cough.  No abdominal pain, early satiety, nausea, vomiting, dysuria, hematuria, or vaginal drainage.  No rashes, swollen lymph nodes, or neurological  deficits.  Patient presents today to review the recently obtained lab work.  Currently, reporting muscle aches, cough and joint aches.  Denies chills, rigors.  No shortness of breath, abdominal pain, nausea, or diarrhea.  No dysuria or hematuria.  Oncological/hematological History: --Labs, 01/27/09: WBC 3.9,                                                 Hgb 13.1, MCV 90.3, MCH 34.5, Plt 261  --Labs, 04/19/16: WBC 3.0, ANC 1.5, ALC 1.2, Mono 0.3, Hgb 14.3, MCV 90.3, MCH 30.0, Plt 212; --Labs, 06/07/16: WBC 4.1, ANC 1.7, ALC 1.8, Mono 0.4, Hgb 13.0, MCV 91.4, MCH 30.2, Plt 250; --Labs, 08/24/16: WBC 3.3, ANC 1.3, ALC 1.5, Mono 0.4, Hgb 13.0, MCV 91.8, MCH 30.3, Plt 220; --Labs, 03/02/17: WBC 2.7, ANC 1.1, ALC 1.2, Mono 0.3, Hgb 12.3, MCV 90.2, MCH 30.1, Plt 221; --Labs, 03/22/17: WBC 3.3, ANC 1.7, ALC 1.1, Mono 0.3, Hgb 12.6,     Plt 205; ESR 5, CRP <0.8; Cu 107, Folate 17.9, Vit B12 1495, MMA 120; EBV -- negative for IgM, positive VCA, NA, & EAg IgG, HBV -- positive s-Ab, c-Ab and negative s-Ag     No history exists.    Medical History: Past Medical History:  Diagnosis Date  . Anemia     Surgical History: Past Surgical History:  Procedure Laterality Date  . NECK SURGERY      Family History: Family History  Problem Relation Age of Onset  . Cancer Mother   . Hypertension Mother   . Cancer Brother     Social History: Social History   Socioeconomic History  . Marital status: Single    Spouse name: Not on file  . Number of children: Not on file  . Years of education: Not on file  . Highest education level: Not on file  Social Needs  . Financial resource strain: Not on file  . Food insecurity - worry: Not on file  . Food insecurity - inability: Not on file  . Transportation needs - medical: Not on file  . Transportation needs - non-medical: Not on file  Occupational History  . Not on file  Tobacco Use  . Smoking status: Never Smoker  . Smokeless tobacco: Never Used   Substance and Sexual Activity  . Alcohol use: No    Frequency: Never  . Drug use: No  . Sexual activity: Not on file  Other Topics Concern  . Not on file  Social History Narrative  . Not on file    Allergies: Allergies  Allergen Reactions  . Sulfa Antibiotics     Medications:  Current Outpatient Medications  Medication Sig Dispense Refill  . sertraline (ZOLOFT) 100 MG tablet Take 500 mg by mouth daily.     No current facility-administered medications for this visit.     Review of Systems: Review of Systems  Constitutional: Positive for appetite change.  Gastrointestinal: Positive for diarrhea. Negative for blood in stool.  All other systems reviewed and are negative.  PHYSICAL EXAMINATION Blood pressure 132/70, pulse 79, temperature (!) 101.4 F (38.6 C), temperature source Oral, resp. rate 17, height 5' 8"  (1.727 m), weight 141 lb 4.8 oz (64.1 kg), SpO2 100 %.  ECOG PERFORMANCE STATUS: 1 - Symptomatic but completely ambulatory  Physical Exam  Constitutional: She is oriented to person, place, and time and well-developed, well-nourished, and in no distress. No distress.  HENT:  Head: Normocephalic and atraumatic.  Mouth/Throat: Oropharynx is clear and moist. No oropharyngeal exudate.  Eyes: Conjunctivae and EOM are normal. Pupils are equal, round, and reactive to light. No scleral icterus.  Neck: No thyromegaly present.  Cardiovascular: Normal rate, regular rhythm and normal heart sounds.  No murmur heard. Pulmonary/Chest: Effort normal and breath sounds normal. No respiratory distress. She has no wheezes. She has no rales.  Abdominal: Soft. Bowel sounds are normal. She exhibits no distension and no mass. There is no tenderness. There is no guarding.  Musculoskeletal: She exhibits no edema.  Lymphadenopathy:    She has no cervical adenopathy.  Neurological: She is alert and oriented to person, place, and time. She has normal reflexes. No cranial nerve deficit.   Skin: Skin is warm and dry. No rash noted. She is not diaphoretic. No erythema. No pallor.     LABORATORY DATA: I have personally reviewed the data as listed: Appointment on 03/29/2017  Component Date Value Ref Range Status  . HBV DNA SERPL PCR-ACNC 03/29/2017 HBV DNA not detected  IU/mL Final  . HBV DNA SERPL PCR-LOG IU 03/29/2017 UNABLE TO CALCULATE  log10 IU/mL Final   Comment: (NOTE) Unable to calculate result since non-numeric result obtained for component test.   . Test Info: 03/29/2017 Comment   Final   Comment: (NOTE) The reportable range for this assay is 10 IU/mL to 1 billion IU/mL. Performed At: [facility] [place], [place] [id number] Rush Farmer MD [id number] Performed at [facility], [place] [address] [place], [place] [address]   . WBC Count 03/29/2017 3.6* 3.9 - 10.3 K/uL Final  . RBC 03/29/2017 4.10  3.70 - 5.45 MIL/uL Final  . Hemoglobin 03/29/2017 12.4  11.6 - 15.9 g/dL Final  . HCT 03/29/2017 37.6  34.8 - 46.6 % Final  . MCV 03/29/2017 91.7  79.5 - 101.0 fL Final  . MCH 03/29/2017 30.1  25.1 - 34.0 pg Final  . MCHC 03/29/2017 32.9  31.5 - 36.0 g/dL Final  . RDW 03/29/2017 13.0  11.2 - 14.5 % Final  . Platelet Count 03/29/2017 190  145 - 400 K/uL Final  . Neutrophils Relative % 03/29/2017 70  % Final  . Neutro Abs 03/29/2017 2.6  1.5 - 6.5 K/uL Final  . Lymphocytes Relative 03/29/2017 9  % Final  . Lymphs Abs 03/29/2017 0.3* 0.9 - 3.3 K/uL Final  . Monocytes Relative 03/29/2017 20  % Final  . Monocytes Absolute 03/29/2017 0.7  0.1 - 0.9 K/uL Final  . Eosinophils Relative 03/29/2017 1  % Final  . Eosinophils Absolute 03/29/2017 0.0  0.0 - 0.5 K/uL Final  . Basophils Relative 03/29/2017 0  % Final  . Basophils Absolute 03/29/2017 0.0  0.0 - 0.1 K/uL Final   Performed at [facility], [place] [address], [place], [place] [address]       Ardath Sax, MD

## 2017-04-25 ENCOUNTER — Other Ambulatory Visit: Payer: Self-pay

## 2017-04-25 DIAGNOSIS — D709 Neutropenia, unspecified: Secondary | ICD-10-CM

## 2017-04-26 ENCOUNTER — Inpatient Hospital Stay: Payer: 59

## 2017-04-26 ENCOUNTER — Inpatient Hospital Stay: Payer: 59 | Attending: Hematology and Oncology | Admitting: Hematology and Oncology

## 2017-04-26 ENCOUNTER — Telehealth: Payer: Self-pay | Admitting: Hematology and Oncology

## 2017-04-26 VITALS — BP 110/77 | HR 68 | Temp 98.4°F | Resp 20 | Ht 68.0 in | Wt 138.1 lb

## 2017-04-26 DIAGNOSIS — M2550 Pain in unspecified joint: Secondary | ICD-10-CM

## 2017-04-26 DIAGNOSIS — D709 Neutropenia, unspecified: Secondary | ICD-10-CM | POA: Diagnosis present

## 2017-04-26 DIAGNOSIS — M791 Myalgia, unspecified site: Secondary | ICD-10-CM | POA: Diagnosis not present

## 2017-04-26 DIAGNOSIS — R0982 Postnasal drip: Secondary | ICD-10-CM | POA: Diagnosis not present

## 2017-04-26 DIAGNOSIS — I10 Essential (primary) hypertension: Secondary | ICD-10-CM | POA: Diagnosis not present

## 2017-04-26 DIAGNOSIS — R05 Cough: Secondary | ICD-10-CM | POA: Diagnosis not present

## 2017-04-26 LAB — CMP (CANCER CENTER ONLY)
ALK PHOS: 84 U/L (ref 40–150)
ALT: 13 U/L (ref 0–55)
AST: 15 U/L (ref 5–34)
Albumin: 4 g/dL (ref 3.5–5.0)
Anion gap: 9 (ref 3–11)
BILIRUBIN TOTAL: 0.7 mg/dL (ref 0.2–1.2)
BUN: 15 mg/dL (ref 7–26)
CALCIUM: 10.1 mg/dL (ref 8.4–10.4)
CO2: 27 mmol/L (ref 22–29)
CREATININE: 0.83 mg/dL (ref 0.60–1.10)
Chloride: 105 mmol/L (ref 98–109)
Glucose, Bld: 92 mg/dL (ref 70–140)
Potassium: 3.8 mmol/L (ref 3.5–5.1)
Sodium: 141 mmol/L (ref 136–145)
TOTAL PROTEIN: 7.6 g/dL (ref 6.4–8.3)

## 2017-04-26 LAB — LACTATE DEHYDROGENASE: LDH: 151 U/L (ref 125–245)

## 2017-04-26 LAB — CBC WITH DIFFERENTIAL (CANCER CENTER ONLY)
BASOS ABS: 0 10*3/uL (ref 0.0–0.1)
Basophils Relative: 1 %
Eosinophils Absolute: 0.1 10*3/uL (ref 0.0–0.5)
Eosinophils Relative: 3 %
HEMATOCRIT: 40.3 % (ref 34.8–46.6)
Hemoglobin: 13.2 g/dL (ref 11.6–15.9)
LYMPHS ABS: 1.1 10*3/uL (ref 0.9–3.3)
Lymphocytes Relative: 32 %
MCH: 30.2 pg (ref 25.1–34.0)
MCHC: 32.8 g/dL (ref 31.5–36.0)
MCV: 92.1 fL (ref 79.5–101.0)
Monocytes Absolute: 0.4 10*3/uL (ref 0.1–0.9)
Monocytes Relative: 10 %
NEUTROS ABS: 1.9 10*3/uL (ref 1.5–6.5)
Neutrophils Relative %: 54 %
Platelet Count: 225 10*3/uL (ref 145–400)
RBC: 4.38 MIL/uL (ref 3.70–5.45)
RDW: 13.8 % (ref 11.2–14.5)
WBC: 3.5 10*3/uL — AB (ref 3.9–10.3)

## 2017-04-26 NOTE — Telephone Encounter (Signed)
Scheduled appt per 3/5 los - Gave patient AVS and calender per los.  

## 2017-05-06 ENCOUNTER — Encounter: Payer: Self-pay | Admitting: Hematology and Oncology

## 2017-05-06 NOTE — Assessment & Plan Note (Signed)
62 y.o. female with mild neutropenia of unclear duration, but at least since July of last year.  Likely gradual decline that started as early is April 2018.  No specific systemic symptoms to suggest persistent infection, nutritional deficiency, or malignancy at this time.  Repeat blood work demonstrates improved white blood cell counts with resolution of neutropenia with absolute neutrophil count 1.7 today.  No new abnormalities in the white blood cell count, hemoglobin, or platelet counts.  Lab work is significant for either previous exposure or recent reactivation of EBV based on the positivity of the CA, NA, and early antigen immunoglobulin G values.  Of interest, patient does have positive antibodies for the surface and capsid proteins over the hepatitis B, but negative antigen for the surface of the hepatitis B.  patient tested negative for hepatitis B DNA likely consistent with previous immunization.   No new symptoms since last visit to the clinic.  Lab work remains essentially unchanged. Etiology of leukopenia remains unclear.  Plan: -Return to clinic in 4 months for continued hematological monitoring.

## 2017-05-06 NOTE — Progress Notes (Signed)
[facility] Follow-up Visit:  Assessment: Neutropenia [facility]) 62 y.o. female with mild neutropenia of unclear duration, but at least since July of last year.  Likely gradual decline that started as early is April 2018.  No specific systemic symptoms to suggest persistent infection, nutritional deficiency, or malignancy at this time.  Repeat blood work demonstrates improved white blood cell counts with resolution of neutropenia with absolute neutrophil count 1.7 today.  No new abnormalities in the white blood cell count, hemoglobin, or platelet counts.  Lab work is significant for either previous exposure or recent reactivation of EBV based on the positivity of the CA, NA, and early antigen immunoglobulin G values.  Of interest, patient does have positive antibodies for the surface and capsid proteins over the hepatitis B, but negative antigen for the surface of the hepatitis B.  patient tested negative for hepatitis B DNA likely consistent with previous immunization.   No new symptoms since last visit to the clinic.  Lab work remains essentially unchanged. Etiology of leukopenia remains unclear.  Plan: -Return to clinic in 4 months for continued hematological monitoring.  Voice recognition software was used and creation of this note. Despite my best effort at editing the text, some misspelling/errors may have occurred.  Orders Placed This Encounter  Procedures  . CBC with Differential (Cancer Center Only)    Standing Status:   Future    Standing Expiration Date:   04/27/2018  . CMP ([facility] only)    Standing Status:   Future    Standing Expiration Date:   04/27/2018    Cancer Staging No matching staging information was found for the patient.  All questions were answered.  . The patient knows to call the clinic with any problems, questions or concerns.  This note was electronically signed.    History of Presenting Illness Cristina Bailey is a 62 y.o. female  followed in the [facility] for low white blood cell count, referred by Dr Lucianne Lei.  Patient's past medical history significant for hypertension, depression/anxiety.  Most recent mammogram in February 2017, last colonoscopy in 2014, both studies are due for repeat.  Patient's family history is significant for brother with colon cancer at age of 35, mother with breast cancer at age of 14, father with history of congestive heart failure and deep vein thrombosis.  Patient is a non-smoker and uses minimal amounts of alcohol socially.  Regarding her symptoms, patient reports not eating well/enough, overall, she reports significant number of changes since beginning of her menopause in particular, she complains of mental fog in the morning when she gets up lasting for anywhere between about 2 hours.  Past year has been difficult for her and April through July she had a lot of personal stress taking care of her elderly mother.  November through December patient had an upper respiratory tract infection requiring treatment with azithromycin with postinfectious cough which has now resolved over the next several weeks.  Occasional diarrhea without hematochezia or melena.  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  No recent exacerbations.  Patient denies current fevers, chills, night sweats.  No shortness of breath, chest pain, or cough.  No abdominal pain, early satiety, nausea, vomiting, dysuria, hematuria, or vaginal drainage.  No rashes, swollen lymph nodes, or neurological deficits.  Patient presents today to review the recently obtained lab work.  Currently, reporting muscle aches, cough and joint aches.  Denies chills, rigors.  No shortness  of breath, abdominal pain, nausea, or diarrhea.  No dysuria or hematuria.  Patient also complains of persistent postnasal drip and dry cough.  Oncological/hematological History: --Labs, 01/27/09: WBC 3.9,                                                  Hgb 13.1, MCV 90.3, MCH 34.5, Plt 261  --Labs, 04/19/16: WBC 3.0, ANC 1.5, ALC 1.2, Mono 0.3, Hgb 14.3, MCV 90.3, MCH 30.0, Plt 212; --Labs, 06/07/16: WBC 4.1, ANC 1.7, ALC 1.8, Mono 0.4, Hgb 13.0, MCV 91.4, MCH 30.2, Plt 250; --Labs, 08/24/16: WBC 3.3, ANC 1.3, ALC 1.5, Mono 0.4, Hgb 13.0, MCV 91.8, MCH 30.3, Plt 220; --Labs, 03/02/17: WBC 2.7, ANC 1.1, ALC 1.2, Mono 0.3, Hgb 12.3, MCV 90.2, MCH 30.1, Plt 221; --Labs, 03/22/17: WBC 3.3, ANC 1.7, ALC 1.1, Mono 0.3, Hgb 12.6,     Plt 205; ESR 5, CRP <0.8; Cu 107, Folate 17.9, Vit B12 1495, MMA 120; EBV -- negative for IgM, positive VCA, NA, & EAg IgG, HBV -- positive s-Ab, c-Ab and negative s-Ag  --Labs, 03/29/17: WBC 3.6, ANC 2.6, ALC 0.3, Mono 0.7, Hgb 12.4,     Plt 190; HBV DNA -- negative --Labs, 04/26/17: WBC 3.5, ANC 1.9, ALC 1.1, Mono 0.4, Hgb 13.2,     Plt 225;    No history exists.    Medical History: Past Medical History:  Diagnosis Date  . Anemia     Surgical History: Past Surgical History:  Procedure Laterality Date  . NECK SURGERY      Family History: Family History  Problem Relation Age of Onset  . Cancer Mother   . Hypertension Mother   . Cancer Brother     Social History: Social History   Socioeconomic History  . Marital status: Single    Spouse name: Not on file  . Number of children: Not on file  . Years of education: Not on file  . Highest education level: Not on file  Social Needs  . Financial resource strain: Not on file  . Food insecurity - worry: Not on file  . Food insecurity - inability: Not on file  . Transportation needs - medical: Not on file  . Transportation needs - non-medical: Not on file  Occupational History  . Not on file  Tobacco Use  . Smoking status: Never Smoker  . Smokeless tobacco: Never Used  Substance and Sexual Activity  . Alcohol use: No    Frequency: Never  . Drug use: No  . Sexual activity: Not on file  Other Topics Concern  . Not on file  Social History  Narrative  . Not on file    Allergies: Allergies  Allergen Reactions  . Sulfa Antibiotics     Medications:  Current Outpatient Medications  Medication Sig Dispense Refill  . sertraline (ZOLOFT) 100 MG tablet Take 500 mg by mouth daily.     No current facility-administered medications for this visit.     Review of Systems: Review of Systems  Constitutional: Positive for appetite change.  Gastrointestinal: Positive for diarrhea. Negative for blood in stool.  All other systems reviewed and are negative.    PHYSICAL EXAMINATION Blood pressure 110/77, pulse 68, temperature 98.4 F (36.9 C), resp. rate 20, height 5' 8" (1.727 m), weight 138 lb 1.6 oz (62.6 kg), SpO2 100 %.  ECOG PERFORMANCE STATUS: 1 - Symptomatic but completely ambulatory  Physical Exam  Constitutional: She is oriented to person, place, and time and well-developed, well-nourished, and in no distress. No distress.  HENT:  Head: Normocephalic and atraumatic.  Mouth/Throat: Oropharynx is clear and moist. No oropharyngeal exudate.  Eyes: Conjunctivae and EOM are normal. Pupils are equal, round, and reactive to light. No scleral icterus.  Neck: No thyromegaly present.  Cardiovascular: Normal rate, regular rhythm and normal heart sounds.  No murmur heard. Pulmonary/Chest: Effort normal and breath sounds normal. No respiratory distress. She has no wheezes. She has no rales.  Abdominal: Soft. Bowel sounds are normal. She exhibits no distension and no mass. There is no tenderness. There is no guarding.  Musculoskeletal: She exhibits no edema.  Lymphadenopathy:    She has no cervical adenopathy.  Neurological: She is alert and oriented to person, place, and time. She has normal reflexes. No cranial nerve deficit.  Skin: Skin is warm and dry. No rash noted. She is not diaphoretic. No erythema. No pallor.     LABORATORY DATA: I have personally reviewed the data as listed: Appointment on 04/26/2017  Component Date  Value Ref Range Status  . WBC Count 04/26/2017 3.5* 3.9 - 10.3 K/uL Final  . RBC 04/26/2017 4.38  3.70 - 5.45 MIL/uL Final  . Hemoglobin 04/26/2017 13.2  11.6 - 15.9 g/dL Final  . HCT 04/26/2017 40.3  34.8 - 46.6 % Final  . MCV 04/26/2017 92.1  79.5 - 101.0 fL Final  . MCH 04/26/2017 30.2  25.1 - 34.0 pg Final  . MCHC 04/26/2017 32.8  31.5 - 36.0 g/dL Final  . RDW 04/26/2017 13.8  11.2 - 14.5 % Final  . Platelet Count 04/26/2017 225  145 - 400 K/uL Final  . Neutrophils Relative % 04/26/2017 54  % Final  . Neutro Abs 04/26/2017 1.9  1.5 - 6.5 K/uL Final  . Lymphocytes Relative 04/26/2017 32  % Final  . Lymphs Abs 04/26/2017 1.1  0.9 - 3.3 K/uL Final  . Monocytes Relative 04/26/2017 10  % Final  . Monocytes Absolute 04/26/2017 0.4  0.1 - 0.9 K/uL Final  . Eosinophils Relative 04/26/2017 3  % Final  . Eosinophils Absolute 04/26/2017 0.1  0.0 - 0.5 K/uL Final  . Basophils Relative 04/26/2017 1  % Final  . Basophils Absolute 04/26/2017 0.0  0.0 - 0.1 K/uL Final   Performed at [facility], [place] [address], [place], [place] [address]  . Sodium 04/26/2017 141  136 - 145 mmol/L Final  . Potassium 04/26/2017 3.8  3.5 - 5.1 mmol/L Final  . Chloride 04/26/2017 105  98 - 109 mmol/L Final  . CO2 04/26/2017 27  22 - 29 mmol/L Final  . Glucose, Bld 04/26/2017 92  70 - 140 mg/dL Final  . BUN 04/26/2017 15  7 - 26 mg/dL Final  . Creatinine 04/26/2017 0.83  0.60 - 1.10 mg/dL Final  . Calcium 04/26/2017 10.1  8.4 - 10.4 mg/dL Final  . Total Protein 04/26/2017 7.6  6.4 - 8.3 g/dL Final  . Albumin 04/26/2017 4.0  3.5 - 5.0 g/dL Final  . AST 04/26/2017 15  5 - 34 U/L Final  . ALT 04/26/2017 13  0 - 55 U/L Final  . Alkaline Phosphatase 04/26/2017 84  40 - 150 U/L Final  . Total Bilirubin 04/26/2017 0.7  0.2 - 1.2 mg/dL Final  . GFR, Est Non Af Am 04/26/2017 >60  >60 mL/min Final  . GFR,  Est AFR Am 04/26/2017 >60  >60 mL/min Final   Comment: (NOTE) The eGFR has been calculated  using the CKD EPI equation. This calculation has not been validated in all clinical situations. eGFR's persistently <60 mL/min signify possible Chronic Kidney Disease.   Georgiann Hahn gap 04/26/2017 9  3 - 11 Final   Performed at [facility], [place] [address], [place], [place] [address]  . LDH 04/26/2017 151  125 - 245 U/L Final   Performed at [facility], [place] [address] [place], [place] [address]       Ardath Sax, MD

## 2017-07-29 ENCOUNTER — Telehealth: Payer: Self-pay | Admitting: Hematology and Oncology

## 2017-07-29 NOTE — Telephone Encounter (Signed)
Called pt re appts being mvoed to [facility] - left vm for pt and sent out confirmation letter.

## 2017-08-29 ENCOUNTER — Inpatient Hospital Stay: Payer: 59 | Attending: Adult Health

## 2017-08-29 ENCOUNTER — Inpatient Hospital Stay (HOSPITAL_BASED_OUTPATIENT_CLINIC_OR_DEPARTMENT_OTHER): Payer: 59 | Admitting: Adult Health

## 2017-08-29 ENCOUNTER — Encounter: Payer: Self-pay | Admitting: Adult Health

## 2017-08-29 VITALS — BP 140/68 | HR 57 | Temp 98.3°F | Resp 18 | Ht 68.0 in | Wt 136.7 lb

## 2017-08-29 DIAGNOSIS — D709 Neutropenia, unspecified: Secondary | ICD-10-CM

## 2017-08-29 DIAGNOSIS — A6004 Herpesviral vulvovaginitis: Secondary | ICD-10-CM | POA: Insufficient documentation

## 2017-08-29 DIAGNOSIS — Z809 Family history of malignant neoplasm, unspecified: Secondary | ICD-10-CM | POA: Diagnosis not present

## 2017-08-29 DIAGNOSIS — A6000 Herpesviral infection of urogenital system, unspecified: Secondary | ICD-10-CM | POA: Insufficient documentation

## 2017-08-29 LAB — CBC WITH DIFFERENTIAL (CANCER CENTER ONLY)
Basophils Absolute: 0 10*3/uL (ref 0.0–0.1)
Basophils Relative: 1 %
Eosinophils Absolute: 0.1 10*3/uL (ref 0.0–0.5)
Eosinophils Relative: 3 %
HEMATOCRIT: 37.1 % (ref 34.8–46.6)
HEMOGLOBIN: 12.4 g/dL (ref 11.6–15.9)
LYMPHS PCT: 33 %
Lymphs Abs: 1 10*3/uL (ref 0.9–3.3)
MCH: 30.3 pg (ref 25.1–34.0)
MCHC: 33.4 g/dL (ref 31.5–36.0)
MCV: 90.5 fL (ref 79.5–101.0)
MONOS PCT: 15 %
Monocytes Absolute: 0.4 10*3/uL (ref 0.1–0.9)
NEUTROS PCT: 48 %
Neutro Abs: 1.4 10*3/uL — ABNORMAL LOW (ref 1.5–6.5)
Platelet Count: 218 10*3/uL (ref 145–400)
RBC: 4.09 MIL/uL (ref 3.70–5.45)
RDW: 13.4 % (ref 11.2–14.5)
WBC: 2.9 10*3/uL — AB (ref 3.9–10.3)

## 2017-08-29 LAB — CMP (CANCER CENTER ONLY)
ALK PHOS: 84 U/L (ref 38–126)
ALT: 17 U/L (ref 0–44)
AST: 19 U/L (ref 15–41)
Albumin: 4 g/dL (ref 3.5–5.0)
Anion gap: 6 (ref 5–15)
BILIRUBIN TOTAL: 0.8 mg/dL (ref 0.3–1.2)
BUN: 18 mg/dL (ref 8–23)
CALCIUM: 9.7 mg/dL (ref 8.9–10.3)
CO2: 27 mmol/L (ref 22–32)
CREATININE: 0.92 mg/dL (ref 0.44–1.00)
Chloride: 106 mmol/L (ref 98–111)
Glucose, Bld: 96 mg/dL (ref 70–99)
Potassium: 3.9 mmol/L (ref 3.5–5.1)
Sodium: 139 mmol/L (ref 135–145)
TOTAL PROTEIN: 7.4 g/dL (ref 6.5–8.1)

## 2017-08-29 MED ORDER — VALACYCLOVIR HCL 1 G PO TABS: 1000.0000 mg | ORAL_TABLET | Freq: Every day | ORAL | 11 refills | Status: AC
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
## 2017-08-30 ENCOUNTER — Encounter: Payer: Self-pay | Admitting: Adult Health

## 2017-08-30 ENCOUNTER — Other Ambulatory Visit: Payer: 59

## 2017-08-30 ENCOUNTER — Telehealth: Payer: Self-pay | Admitting: Adult Health

## 2017-08-30 ENCOUNTER — Ambulatory Visit: Payer: 59 | Admitting: Hematology and Oncology

## 2017-08-30 NOTE — Telephone Encounter (Signed)
Per 7/8 no los °

## 2017-11-28 NOTE — Progress Notes (Signed)
[facility]  Telephone:(336) [phone] [phone]) [phone]     ID: Cristina Bailey DOB: Sep 11, 1955  MR#: [id number]  TTS#:[id number]  Patient Care Team: Lucianne Lei, MD as PCP - General (Family Medicine) OTHER MD:  CHIEF COMPLAINT: Neutropenia  CURRENT TREATMENT: Observation   HISTORY OF CURRENT ILLNESS:  Cristina Bailey presented to our office back in 02/2017 for evaluation by Dr. Lebron Conners due to a progressive and persistent neutropenia noted by her PCP.  She tells me that she has always had a low WBC level, however once moving in with her mother, she feels like her WBC have decreased even further.    Cristina Bailey did undergo several lab tests when she first saw Dr. Lebron Conners, including a normal ESR and CRP. Hepatitis b testing indicating past exposure and immunity, EBV testing indicating an elevated IGG, but normal IGM, negative HIV.    The patient's subsequent history is as detailed below.  INTERVAL HISTORY: Cristina Bailey returns today for follow up of her neutropenia. She continues under observation. She has been doing well. She has not started taking Valtrex. She denies having any intercurrent viral infections.    REVIEW OF SYSTEMS: Cristina Bailey is also taking Zoloft, which she tolerates well. She denies unusual headaches, visual changes, nausea, vomiting, or dizziness. There has been no unusual cough, phlegm production, or pleurisy. There has been no change in bowel or bladder habits. She denies unexplained fatigue or unexplained weight loss, bleeding, rash, or fever. A detailed review of systems was otherwise stable.    PAST MEDICAL HISTORY: Past Medical History:  Diagnosis Date  . Anemia   . Herpes     PAST SURGICAL HISTORY: Past Surgical History:  Procedure Laterality Date  . NECK SURGERY      FAMILY HISTORY Family History  Problem Relation Age of Onset  . Cancer Mother   . Hypertension Mother   . Cancer Brother     GYNECOLOGIC HISTORY:  No LMP recorded. Patient is  postmenopausal.   SOCIAL HISTORY: lives in [place] with her mom, single, no children, she works at the [employer] ([employer] in [employer].    ADVANCED DIRECTIVES: in place, we do not have a copy, but she tells me that her mom is her HCPOA   HEALTH MAINTENANCE: Social History   Tobacco Use  . Smoking status: Never Smoker  . Smokeless tobacco: Never Used  Substance Use Topics  . Alcohol use: No    Frequency: Never  . Drug use: No     Colonoscopy:  PAP:  Bone density:   Allergies  Allergen Reactions  . Sulfa Antibiotics     Current Outpatient Medications  Medication Sig Dispense Refill  . sertraline (ZOLOFT) 50 MG tablet Take 50 mg by mouth daily.  9  . valACYclovir (VALTREX) 1000 MG tablet Take 1 tablet (1,000 mg total) by mouth daily. 30 tablet 11   No current facility-administered medications for this visit.     OBJECTIVE: Middle-aged African-American woman who appears well  Vitals:   11/29/17 1200  BP: (!) 118/57  Pulse: (!) 55  Resp: 18  Temp: 99.1 F (37.3 C)  SpO2: 100%     Body mass index is 21.2 kg/m.   Wt Readings from Last 3 Encounters:  11/29/17 139 lb 6.4 oz (63.2 kg)  08/29/17 136 lb 11.2 oz (62 kg)  04/26/17 138 lb 1.6 oz (62.6 kg)      ECOG FS:0 - Asymptomatic  Sclerae unicteric, EOMs intact Oropharynx clear and moist No cervical or supraclavicular adenopathy Lungs  no rales or rhonchi Heart regular rate and rhythm Abd soft, nontender, positive bowel sounds MSK no focal spinal tenderness, no upper extremity lymphedema Neuro: nonfocal, well oriented, appropriate affect Breasts: No masses palpated in either breast.  Both axillae are benign      LAB RESULTS:  CMP     Component Value Date/Time   NA 139 08/29/2017 1240   K 3.9 08/29/2017 1240   CL 106 08/29/2017 1240   CO2 27 08/29/2017 1240   GLUCOSE 96 08/29/2017 1240   BUN 18 08/29/2017 1240   CREATININE 0.92 08/29/2017 1240   CALCIUM 9.7 08/29/2017 1240   PROT 7.4 08/29/2017 1240    ALBUMIN 4.0 08/29/2017 1240   AST 19 08/29/2017 1240   ALT 17 08/29/2017 1240   ALKPHOS 84 08/29/2017 1240   BILITOT 0.8 08/29/2017 1240   GFRNONAA >60 08/29/2017 1240   GFRAA >60 08/29/2017 1240    No results found for: TOTALPROTELP, ALBUMINELP, A1GS, A2GS, BETS, BETA2SER, GAMS, MSPIKE, SPEI  No results found for: Nils Pyle, [facility]  Lab Results  Component Value Date   WBC 3.1 (L) 11/29/2017   NEUTROABS 1.6 (L) 11/29/2017   HGB 12.9 11/29/2017   HCT 38.6 11/29/2017   MCV 90.8 11/29/2017   PLT 204 11/29/2017      Chemistry      Component Value Date/Time   NA 139 08/29/2017 1240   K 3.9 08/29/2017 1240   CL 106 08/29/2017 1240   CO2 27 08/29/2017 1240   BUN 18 08/29/2017 1240   CREATININE 0.92 08/29/2017 1240      Component Value Date/Time   CALCIUM 9.7 08/29/2017 1240   ALKPHOS 84 08/29/2017 1240   AST 19 08/29/2017 1240   ALT 17 08/29/2017 1240   BILITOT 0.8 08/29/2017 1240       No results found for: LABCA2  No components found for: YQIHKV425  No results for input(s): INR in the last 168 hours.  No results found for: LABCA2  No results found for: ZDG387  No results found for: FIE332  No results found for: RJJ884  No results found for: CA2729  No components found for: HGQUANT  No results found for: CEA1 / No results found for: CEA1   No results found for: AFPTUMOR  No results found for: Gleason  No results found for: PSA1  Appointment on 11/29/2017  Component Date Value Ref Range Status  . WBC Count 11/29/2017 3.1* 4.0 - 10.5 K/uL Final  . RBC 11/29/2017 4.25  3.87 - 5.11 MIL/uL Final  . Hemoglobin 11/29/2017 12.9  12.0 - 15.0 g/dL Final  . HCT 11/29/2017 38.6  36.0 - 46.0 % Final  . MCV 11/29/2017 90.8  80.0 - 100.0 fL Final  . MCH 11/29/2017 30.4  26.0 - 34.0 pg Final  . MCHC 11/29/2017 33.4  30.0 - 36.0 g/dL Final  . RDW 11/29/2017 13.0  11.5 - 15.5 % Final  . Platelet Count 11/29/2017 204  150 - 400 K/uL Final    . nRBC 11/29/2017 0.0  0.0 - 0.2 % Final  . Neutrophils Relative % 11/29/2017 52  % Final  . Neutro Abs 11/29/2017 1.6* 1.7 - 7.7 K/uL Final  . Lymphocytes Relative 11/29/2017 35  % Final  . Lymphs Abs 11/29/2017 1.1  0.7 - 4.0 K/uL Final  . Monocytes Relative 11/29/2017 11  % Final  . Monocytes Absolute 11/29/2017 0.4  0.1 - 1.0 K/uL Final  . Eosinophils Relative 11/29/2017 2  % Final  . Eosinophils  Absolute 11/29/2017 0.1  0.0 - 0.5 K/uL Final  . Basophils Relative 11/29/2017 0  % Final  . Basophils Absolute 11/29/2017 0.0  0.0 - 0.1 K/uL Final  . Immature Granulocytes 11/29/2017 0  % Final  . Abs Immature Granulocytes 11/29/2017 0.01  0.00 - 0.07 K/uL Final   Performed at [facility], [place] [address], [place], [place] [address]    (this displays the last labs from the last 3 days)  No results found for: TOTALPROTELP, ALBUMINELP, A1GS, A2GS, BETS, BETA2SER, GAMS, MSPIKE, SPEI (this displays SPEP labs)  No results found for: KPAFRELGTCHN, LAMBDASER, KAPLAMBRATIO (kappa/lambda light chains)  No results found for: HGBA, HGBA2QUANT, HGBFQUANT, HGBSQUAN (Hemoglobinopathy evaluation)   Lab Results  Component Value Date   LDH 151 04/26/2017    No results found for: IRON, TIBC, IRONPCTSAT (Iron and TIBC)  No results found for: FERRITIN  Urinalysis No results found for: COLORURINE, APPEARANCEUR, LABSPEC, PHURINE, GLUCOSEU, HGBUR, BILIRUBINUR, KETONESUR, PROTEINUR, UROBILINOGEN, NITRITE, LEUKOCYTESUR   STUDIES: No results found.  ASSESSMENT: 62 y.o. woman with benign neutropenia being followed in our office since 02/2017.    --Labs, 01/27/09: WBC 3.9, Hgb 13.1, MCV 90.3, MCH 34.5, Plt 261  --Labs, 04/19/16: WBC 3.0, ANC 1.5, ALC 1.2, Mono 0.3, Hgb 14.3, MCV 90.3, MCH 30.0, Plt 212; --Labs, 06/07/16: WBC 4.1, ANC 1.7, ALC 1.8, Mono 0.4, Hgb 13.0, MCV 91.4, MCH 30.2, Plt 250; --Labs, 08/24/16: WBC 3.3, ANC 1.3, ALC 1.5, Mono 0.4, Hgb 13.0, MCV 91.8, MCH  30.3, Plt 220; --Labs, 03/02/17: WBC 2.7, ANC 1.1, ALC 1.2, Mono 0.3, Hgb 12.3, MCV 90.2, MCH 30.1, Plt 221; --Labs, 03/22/17: WBC 3.3, ANC 1.7, ALC 1.1, Mono 0.3, Hgb 12.6, Plt 205; ESR 5, CRP <0.8; Cu 107, Folate 17.9, Vit B12 1495, MMA 120; EBV -- negative for IgM, positive VCA, NA, & EAg IgG, HBV -- positive s-Ab, c-Ab and negative s-Ag  --Labs, 03/29/17: WBC 3.6, ANC 2.6, ALC 0.3, Mono 0.7, Hgb 12.4, Plt 190; HBV DNA -- negative --Labs, 04/26/17: WBC 3.5, ANC 1.9, ALC 1.1, Mono 0.4, Hgb 13.2, Plt 225;    PLAN: Cristina Bailey's counts are slightly improved.  We again discussed the diagnosis of benign neutropenia which is what I believe she has.  This requires only observation.  She is just beginning to have a viral outbreak.  She has the Ultrex on hand.  She had not started it but she will start it now.  When she treats herself she will take it twice a day.  If she wants to take it for prophylaxis she would take it once a day.  That she discontinued Zoloft for almost 2 months around the time of her colonoscopy.  That does not seem to have made any difference to her counts.  She sees Dr. Criss Rosales every 6 months, with a next visit in March.  She gets her labs checked then.  Accordingly we will also check it June and December of next year.  She will see me with the December lab check next year and if everything remains pretty much as before we will discontinue follow-up then  She knows to call for any other issues that may develop before the next visit.     Ladarrius Bogdanski, Virgie Dad, MD  11/29/17 12:15 PM Medical Oncology and Hematology [facility] [address] [place], [place] [address] Tel. [phone]    Fax. [phone]  Cristina Bailey, am acting as scribe for Chauncey Cruel MD.  Cristina Bailey  Izsak Meir MD, have reviewed the above documentation for accuracy and completeness, and I agree with the above.    

## 2017-11-29 ENCOUNTER — Telehealth: Payer: Self-pay | Admitting: Oncology

## 2017-11-29 ENCOUNTER — Inpatient Hospital Stay (HOSPITAL_BASED_OUTPATIENT_CLINIC_OR_DEPARTMENT_OTHER): Payer: 59 | Admitting: Oncology

## 2017-11-29 ENCOUNTER — Inpatient Hospital Stay: Payer: 59 | Attending: Adult Health

## 2017-11-29 VITALS — BP 118/57 | HR 55 | Temp 99.1°F | Resp 18 | Ht 68.0 in | Wt 139.4 lb

## 2017-11-29 DIAGNOSIS — D708 Other neutropenia: Secondary | ICD-10-CM | POA: Insufficient documentation

## 2017-11-29 DIAGNOSIS — D709 Neutropenia, unspecified: Secondary | ICD-10-CM

## 2017-11-29 LAB — CBC WITH DIFFERENTIAL (CANCER CENTER ONLY)
ABS IMMATURE GRANULOCYTES: 0.01 10*3/uL (ref 0.00–0.07)
BASOS PCT: 0 %
Basophils Absolute: 0 10*3/uL (ref 0.0–0.1)
EOS PCT: 2 %
Eosinophils Absolute: 0.1 10*3/uL (ref 0.0–0.5)
HCT: 38.6 % (ref 36.0–46.0)
Hemoglobin: 12.9 g/dL (ref 12.0–15.0)
Immature Granulocytes: 0 %
Lymphocytes Relative: 35 %
Lymphs Abs: 1.1 10*3/uL (ref 0.7–4.0)
MCH: 30.4 pg (ref 26.0–34.0)
MCHC: 33.4 g/dL (ref 30.0–36.0)
MCV: 90.8 fL (ref 80.0–100.0)
MONO ABS: 0.4 10*3/uL (ref 0.1–1.0)
Monocytes Relative: 11 %
NEUTROS ABS: 1.6 10*3/uL — AB (ref 1.7–7.7)
Neutrophils Relative %: 52 %
PLATELETS: 204 10*3/uL (ref 150–400)
RBC: 4.25 MIL/uL (ref 3.87–5.11)
RDW: 13 % (ref 11.5–15.5)
WBC: 3.1 10*3/uL — AB (ref 4.0–10.5)
nRBC: 0 % (ref 0.0–0.2)

## 2017-11-29 LAB — CMP (CANCER CENTER ONLY)
ALT: 13 U/L (ref 0–44)
AST: 19 U/L (ref 15–41)
Albumin: 4 g/dL (ref 3.5–5.0)
Alkaline Phosphatase: 83 U/L (ref 38–126)
Anion gap: 6 (ref 5–15)
BUN: 12 mg/dL (ref 8–23)
CHLORIDE: 106 mmol/L (ref 98–111)
CO2: 29 mmol/L (ref 22–32)
Calcium: 9.9 mg/dL (ref 8.9–10.3)
Creatinine: 0.84 mg/dL (ref 0.44–1.00)
Glucose, Bld: 86 mg/dL (ref 70–99)
POTASSIUM: 4.2 mmol/L (ref 3.5–5.1)
Sodium: 141 mmol/L (ref 135–145)
Total Bilirubin: 1 mg/dL (ref 0.3–1.2)
Total Protein: 7.8 g/dL (ref 6.5–8.1)

## 2017-11-29 NOTE — Telephone Encounter (Signed)
Gave pt avs and calendar  °

## 2018-07-31 ENCOUNTER — Inpatient Hospital Stay: Payer: 59 | Attending: Oncology

## 2018-07-31 ENCOUNTER — Other Ambulatory Visit: Payer: Self-pay

## 2018-07-31 DIAGNOSIS — D708 Other neutropenia: Secondary | ICD-10-CM | POA: Insufficient documentation

## 2018-07-31 DIAGNOSIS — D709 Neutropenia, unspecified: Secondary | ICD-10-CM

## 2018-07-31 LAB — CBC WITH DIFFERENTIAL (CANCER CENTER ONLY)
Abs Immature Granulocytes: 0 10*3/uL (ref 0.00–0.07)
Basophils Absolute: 0 10*3/uL (ref 0.0–0.1)
Basophils Relative: 1 %
Eosinophils Absolute: 0.1 10*3/uL (ref 0.0–0.5)
Eosinophils Relative: 3 %
HCT: 40.4 % (ref 36.0–46.0)
Hemoglobin: 13 g/dL (ref 12.0–15.0)
Immature Granulocytes: 0 %
Lymphocytes Relative: 32 %
Lymphs Abs: 1.1 10*3/uL (ref 0.7–4.0)
MCH: 30.7 pg (ref 26.0–34.0)
MCHC: 32.2 g/dL (ref 30.0–36.0)
MCV: 95.3 fL (ref 80.0–100.0)
Monocytes Absolute: 0.4 10*3/uL (ref 0.1–1.0)
Monocytes Relative: 11 %
Neutro Abs: 1.9 10*3/uL (ref 1.7–7.7)
Neutrophils Relative %: 53 %
Platelet Count: 221 10*3/uL (ref 150–400)
RBC: 4.24 MIL/uL (ref 3.87–5.11)
RDW: 12.9 % (ref 11.5–15.5)
WBC Count: 3.6 10*3/uL — ABNORMAL LOW (ref 4.0–10.5)
nRBC: 0 % (ref 0.0–0.2)

## 2019-01-26 ENCOUNTER — Other Ambulatory Visit: Payer: Self-pay | Admitting: *Deleted

## 2019-01-26 DIAGNOSIS — D709 Neutropenia, unspecified: Secondary | ICD-10-CM

## 2019-01-28 NOTE — Progress Notes (Signed)
[place]  [phone] [phone]     ID: Cristina Bailey DOB: November 09, 1955  MR#: [id number]  JIR#:[id number]  Patient Care Team: Lucianne Lei, MD as PCP - General (Family Medicine) Fortune Brannigan, Virgie Dad, MD as Consulting Physician (Oncology) OTHER MD:  CHIEF COMPLAINT: Neutropenia  CURRENT TREATMENT: Observation   INTERVAL HISTORY: Cristina Bailey returns today for follow up of her neutropenia. She continues under observation.  Her recent white cell count determinations have been as follows Results for Cristina, Bailey (MRN [id number]) as of 01/29/2019 08:17  Ref. Range 03/29/2017 10:09 04/26/2017 10:09 08/29/2017 12:40 11/29/2017 11:41 07/31/2018 11:25  WBC Latest Ref Range: 4.0 - 10.5 K/uL 3.6 (L) 3.5 (L) 2.9 (L) 3.1 (L) 3.6 (L)    REVIEW OF SYSTEMS: Cristina Bailey continues to work full-time.  She also takes care of her mother, who is in her late 104s.  Since the exercises regularly, and is taking appropriate pandemic precautions.  She has had no fevers, drenching sweats, unexplained weight loss or fatigue, or evidence of inflammatory arthritis.  There have been no intercurrent infections.  A detailed review of systems today was otherwise stable   HISTORY OF CURRENT ILLNESS: From the original intake note:  Cristina Bailey presented to our office back in 02/2017 for evaluation by Dr. Lebron Conners due to a progressive and persistent neutropenia noted by her PCP.  She tells me that she has always had a low WBC level, however once moving in with her mother, she feels like her WBC have decreased even further.   --Labs, 01/27/09: WBC 3.9, Hgb 13.1, MCV 90.3, MCH 34.5, Plt 261  --Labs, 04/19/16: WBC 3.0, ANC 1.5, ALC 1.2, Mono 0.3, Hgb 14.3, MCV 90.3, MCH 30.0, Plt 212; --Labs, 06/07/16: WBC 4.1, ANC 1.7, ALC 1.8, Mono 0.4, Hgb 13.0, MCV 91.4, MCH 30.2, Plt 250; --Labs, 08/24/16: WBC 3.3, ANC 1.3, ALC 1.5, Mono 0.4, Hgb 13.0, MCV 91.8, MCH 30.3, Plt 220; --Labs, 03/02/17: WBC 2.7, ANC  1.1, ALC 1.2, Mono 0.3, Hgb 12.3, MCV 90.2, MCH 30.1, Plt 221; --Labs, 03/22/17: WBC 3.3, ANC 1.7, ALC 1.1, Mono 0.3, Hgb 12.6, Plt 205; ESR 5, CRP <0.8; Cu 107, Folate 17.9, Vit B12 1495, MMA 120; EBV -- negative for IgM, positive VCA, NA, & EAg IgG, HBV -- positive s-Ab, c-Ab and negative s-Ag  --Labs, 03/29/17: WBC 3.6, ANC 2.6, ALC 0.3, Mono 0.7, Hgb 12.4, Plt 190; HBV DNA -- negative --Labs, 04/26/17: WBC 3.5, ANC 1.9, ALC 1.1, Mono 0.4, Hgb 13.2, Plt 225;  Cristina Bailey did undergo additional lab tests when she first saw Dr. Lebron Conners, including a normal ESR and CRP. Hepatitis b testing indicating past exposure and immunity, EBV testing indicating an elevated IGG, but normal IGM, negative HIV.    The patient's subsequent history is as detailed below.   PAST MEDICAL HISTORY: Past Medical History:  Diagnosis Date   Anemia    Herpes     PAST SURGICAL HISTORY: Past Surgical History:  Procedure Laterality Date   NECK SURGERY      FAMILY HISTORY Family History  Problem Relation Age of Onset   Cancer Mother    Hypertension Mother    Cancer Brother     GYNECOLOGIC HISTORY:  No LMP recorded. Patient is postmenopausal.   SOCIAL HISTORY: lives in [place] with her mother; the patient is single, no children; she works at the [employer] ([employer] in [employer].   ADVANCED DIRECTIVES: in place, we do not have a copy, but she tells me that her mom is  her HCPOA   HEALTH MAINTENANCE: Social History   Tobacco Use   Smoking status: Never Smoker   Smokeless tobacco: Never Used  Substance Use Topics   Alcohol use: No    Frequency: Never   Drug use: No     Colonoscopy:  PAP:  Bone density:   Allergies  Allergen Reactions   Sulfa Antibiotics     Current Outpatient Medications  Medication Sig Dispense Refill   sertraline (ZOLOFT) 50 MG tablet Take 50 mg by mouth daily.  9   valACYclovir (VALTREX) 1000 MG tablet Take 1 tablet (1,000 mg total) by mouth daily. 30 tablet 11   No  current facility-administered medications for this visit.     OBJECTIVE: Middle-aged African-American woman in no acute distress  Vitals:   01/29/19 1148  BP: 134/80  Pulse: 97  Resp: 17  Temp: 98.3 F (36.8 C)  SpO2: 96%     Body mass index is 21.82 kg/m.   Wt Readings from Last 3 Encounters:  01/29/19 143 lb 8 oz (65.1 kg)  11/29/17 139 lb 6.4 oz (63.2 kg)  08/29/17 136 lb 11.2 oz (62 kg)      ECOG FS:0 - Asymptomatic  Sclerae unicteric, EOMs intact Wearing a mask No cervical or supraclavicular adenopathy, no axillary adenopathy Lungs no rales or rhonchi Heart regular rate and rhythm Abd soft, nontender, positive bowel sounds MSK no focal spinal tenderness, no joint edema or erythema Neuro: nonfocal, well oriented, appropriate affect Breasts: Deferred  LAB RESULTS:  CMP     Component Value Date/Time   NA 141 01/29/2019 1127   K 3.6 01/29/2019 1127   CL 107 01/29/2019 1127   CO2 27 01/29/2019 1127   GLUCOSE 99 01/29/2019 1127   BUN 18 01/29/2019 1127   CREATININE 0.86 01/29/2019 1127   CALCIUM 9.1 01/29/2019 1127   PROT 7.4 01/29/2019 1127   ALBUMIN 4.0 01/29/2019 1127   AST 19 01/29/2019 1127   ALT 14 01/29/2019 1127   ALKPHOS 83 01/29/2019 1127   BILITOT 0.8 01/29/2019 1127   GFRNONAA >60 01/29/2019 1127   GFRAA >60 01/29/2019 1127    No results found for: TOTALPROTELP, ALBUMINELP, A1GS, A2GS, BETS, BETA2SER, GAMS, MSPIKE, SPEI  No results found for: Nils Pyle, [facility]  Lab Results  Component Value Date   WBC 3.2 (L) 01/29/2019   NEUTROABS 1.6 (L) 01/29/2019   HGB 12.8 01/29/2019   HCT 39.3 01/29/2019   MCV 92.3 01/29/2019   PLT 212 01/29/2019      Chemistry      Component Value Date/Time   NA 141 01/29/2019 1127   K 3.6 01/29/2019 1127   CL 107 01/29/2019 1127   CO2 27 01/29/2019 1127   BUN 18 01/29/2019 1127   CREATININE 0.86 01/29/2019 1127      Component Value Date/Time   CALCIUM 9.1 01/29/2019 1127   ALKPHOS  83 01/29/2019 1127   AST 19 01/29/2019 1127   ALT 14 01/29/2019 1127   BILITOT 0.8 01/29/2019 1127       No results found for: LABCA2  No components found for: JQBHAL937  No results for input(s): INR in the last 168 hours.  No results found for: LABCA2  No results found for: TKW409  No results found for: BDZ329  No results found for: JME268  No results found for: CA2729  No components found for: HGQUANT  No results found for: CEA1 / No results found for: CEA1   No results found for: [facility]  No results found for: CHROMOGRNA  No results found for: PSA1  Appointment on 01/29/2019  Component Date Value Ref Range Status   WBC Count 01/29/2019 3.2* 4.0 - 10.5 K/uL Final   RBC 01/29/2019 4.26  3.87 - 5.11 MIL/uL Final   Hemoglobin 01/29/2019 12.8  12.0 - 15.0 g/dL Final   HCT 01/29/2019 39.3  36.0 - 46.0 % Final   MCV 01/29/2019 92.3  80.0 - 100.0 fL Final   MCH 01/29/2019 30.0  26.0 - 34.0 pg Final   MCHC 01/29/2019 32.6  30.0 - 36.0 g/dL Final   RDW 01/29/2019 12.5  11.5 - 15.5 % Final   Platelet Count 01/29/2019 212  150 - 400 K/uL Final   nRBC 01/29/2019 0.0  0.0 - 0.2 % Final   Neutrophils Relative % 01/29/2019 50  % Final   Neutro Abs 01/29/2019 1.6* 1.7 - 7.7 K/uL Final   Lymphocytes Relative 01/29/2019 38  % Final   Lymphs Abs 01/29/2019 1.2  0.7 - 4.0 K/uL Final   Monocytes Relative 01/29/2019 8  % Final   Monocytes Absolute 01/29/2019 0.2  0.1 - 1.0 K/uL Final   Eosinophils Relative 01/29/2019 3  % Final   Eosinophils Absolute 01/29/2019 0.1  0.0 - 0.5 K/uL Final   Basophils Relative 01/29/2019 1  % Final   Basophils Absolute 01/29/2019 0.0  0.0 - 0.1 K/uL Final   Immature Granulocytes 01/29/2019 0  % Final   Abs Immature Granulocytes 01/29/2019 0.01  0.00 - 0.07 K/uL Final   Performed at [facility], [place] [address], [place], [place] [address]   Sodium 01/29/2019 141  135 - 145 mmol/L Final   Potassium  01/29/2019 3.6  3.5 - 5.1 mmol/L Final   Chloride 01/29/2019 107  98 - 111 mmol/L Final   CO2 01/29/2019 27  22 - 32 mmol/L Final   Glucose, Bld 01/29/2019 99  70 - 99 mg/dL Final   BUN 01/29/2019 18  8 - 23 mg/dL Final   Creatinine 01/29/2019 0.86  0.44 - 1.00 mg/dL Final   Calcium 01/29/2019 9.1  8.9 - 10.3 mg/dL Final   Total Protein 01/29/2019 7.4  6.5 - 8.1 g/dL Final   Albumin 01/29/2019 4.0  3.5 - 5.0 g/dL Final   AST 01/29/2019 19  15 - 41 U/L Final   ALT 01/29/2019 14  0 - 44 U/L Final   Alkaline Phosphatase 01/29/2019 83  38 - 126 U/L Final   Total Bilirubin 01/29/2019 0.8  0.3 - 1.2 mg/dL Final   GFR, Est Non Af Am 01/29/2019 >60  >60 mL/min Final   GFR, Est AFR Am 01/29/2019 >60  >60 mL/min Final   Anion gap 01/29/2019 7  5 - 15 Final   Performed at [facility], [place] [address] [place], [place] [address]   Smear Review 01/29/2019 SMEAR STAINED AND AVAILABLE FOR REVIEW   Final   Performed at [facility], [place] [address], [place], [place] [address]    (this displays the last labs from the last 3 days)  No results found for: TOTALPROTELP, ALBUMINELP, A1GS, A2GS, BETS, BETA2SER, GAMS, MSPIKE, SPEI (this displays SPEP labs)  No results found for: KPAFRELGTCHN, LAMBDASER, KAPLAMBRATIO (kappa/lambda light chains)  No results found for: HGBA, HGBA2QUANT, HGBFQUANT, HGBSQUAN (Hemoglobinopathy evaluation)   Lab Results  Component Value Date   LDH 151 04/26/2017    No results found for: IRON, TIBC, IRONPCTSAT (Iron and TIBC)  No results found  for: FERRITIN  Urinalysis No results found for: COLORURINE, APPEARANCEUR, LABSPEC, PHURINE, GLUCOSEU, HGBUR, BILIRUBINUR, KETONESUR, PROTEINUR, UROBILINOGEN, NITRITE, LEUKOCYTESUR   STUDIES: No results found.  ASSESSMENT: 63 y.o. woman with benign neutropenia   PLAN: Cristina Bailey's counts have hovered between 2.9 and 3.6 over the last year.  Recall about 4  African-Americans her white cell count of 3.5 is normal.  Her absolute neutrophil count have been in the normal range except for 1 time, July 2019, where it was minimally low at 1.4.  She has had no intercurrent infections.  We discussed benign neutropenia which may be a familial issue and if she has the opportunity of reviewing CBCs and other family members it would be interesting if they also had white cell counts under 3.5 on a more or less regular basis.  However we discussed the bell or Gaussian curve and reviewed how "normalcy" is determined.  Basically an entire normal population is tested, and if you get a so-called normal or Gaussian distribution you simply mark off standard deviations from the mean in either direction and to clear that the normal range.  That of course remains at about 2% of "normals" will be outside of the range in either direction.  If this were height that means that 2% of people will be very very tall and 2% will be very very short, and yet still be normal.  It is the same with blood counts  I think she very likely is in the small percentage of people that is normal but different.  Certainly the fact that she has not had any infections and that she has no symptoms of any systemic illness supports this.  Accordingly no further follow-up here is needed.  She will continue to see Dr. Criss Rosales her primary care physician on a regular basis.  If Ivery's counts drop steadily and more drastically of course I will be glad to review her case again  Chauncey Cruel, MD  01/29/19 12:59 PM Medical Oncology and Hematology [facility] [place], [place] [address] Tel. [phone]    Fax. [phone]   I, Wilburn Mylar, am acting as scribe for Dr. Virgie Dad. Jarris Kortz.  I, Lurline Del MD, have reviewed the above documentation for accuracy and completeness, and I agree with the above.

## 2019-01-29 ENCOUNTER — Other Ambulatory Visit: Payer: Self-pay

## 2019-01-29 ENCOUNTER — Inpatient Hospital Stay (HOSPITAL_BASED_OUTPATIENT_CLINIC_OR_DEPARTMENT_OTHER): Payer: 59 | Admitting: Oncology

## 2019-01-29 ENCOUNTER — Inpatient Hospital Stay: Payer: 59 | Attending: Oncology

## 2019-01-29 VITALS — BP 134/80 | HR 97 | Temp 98.3°F | Resp 17 | Ht 68.0 in | Wt 143.5 lb

## 2019-01-29 DIAGNOSIS — D709 Neutropenia, unspecified: Secondary | ICD-10-CM | POA: Diagnosis not present

## 2019-01-29 DIAGNOSIS — Z809 Family history of malignant neoplasm, unspecified: Secondary | ICD-10-CM | POA: Diagnosis not present

## 2019-01-29 DIAGNOSIS — D708 Other neutropenia: Secondary | ICD-10-CM | POA: Diagnosis not present

## 2019-01-29 LAB — CMP (CANCER CENTER ONLY)
ALT: 14 U/L (ref 0–44)
AST: 19 U/L (ref 15–41)
Albumin: 4 g/dL (ref 3.5–5.0)
Alkaline Phosphatase: 83 U/L (ref 38–126)
Anion gap: 7 (ref 5–15)
BUN: 18 mg/dL (ref 8–23)
CO2: 27 mmol/L (ref 22–32)
Calcium: 9.1 mg/dL (ref 8.9–10.3)
Chloride: 107 mmol/L (ref 98–111)
Creatinine: 0.86 mg/dL (ref 0.44–1.00)
GFR, Est AFR Am: >60 mL/min (ref >60)
GFR, Estimated: >60 mL/min (ref >60)
Glucose, Bld: 99 mg/dL (ref 70–99)
Potassium: 3.6 mmol/L (ref 3.5–5.1)
Sodium: 141 mmol/L (ref 135–145)
Total Bilirubin: 0.8 mg/dL (ref 0.3–1.2)
Total Protein: 7.4 g/dL (ref 6.5–8.1)

## 2019-01-29 LAB — CBC WITH DIFFERENTIAL (CANCER CENTER ONLY)
Abs Immature Granulocytes: 0.01 10*3/uL (ref 0.00–0.07)
Basophils Absolute: 0 10*3/uL (ref 0.0–0.1)
Basophils Relative: 1 %
Eosinophils Absolute: 0.1 10*3/uL (ref 0.0–0.5)
Eosinophils Relative: 3 %
HCT: 39.3 % (ref 36.0–46.0)
Hemoglobin: 12.8 g/dL (ref 12.0–15.0)
Immature Granulocytes: 0 %
Lymphocytes Relative: 38 %
Lymphs Abs: 1.2 10*3/uL (ref 0.7–4.0)
MCH: 30 pg (ref 26.0–34.0)
MCHC: 32.6 g/dL (ref 30.0–36.0)
MCV: 92.3 fL (ref 80.0–100.0)
Monocytes Absolute: 0.2 10*3/uL (ref 0.1–1.0)
Monocytes Relative: 8 %
Neutro Abs: 1.6 10*3/uL — ABNORMAL LOW (ref 1.7–7.7)
Neutrophils Relative %: 50 %
Platelet Count: 212 10*3/uL (ref 150–400)
RBC: 4.26 MIL/uL (ref 3.87–5.11)
RDW: 12.5 % (ref 11.5–15.5)
WBC Count: 3.2 10*3/uL — ABNORMAL LOW (ref 4.0–10.5)
nRBC: 0 % (ref 0.0–0.2)

## 2019-01-29 LAB — SAVE SMEAR(SSMR), FOR PROVIDER SLIDE REVIEW

## 2019-06-22 ENCOUNTER — Ambulatory Visit: Payer: 59

## 2019-06-23 ENCOUNTER — Ambulatory Visit: Payer: 59 | Attending: Internal Medicine

## 2019-06-23 ENCOUNTER — Ambulatory Visit: Payer: 59

## 2019-06-23 DIAGNOSIS — Z23 Encounter for immunization: Secondary | ICD-10-CM

## 2019-06-23 NOTE — Progress Notes (Signed)
   Covid-19 Vaccination Clinic  Name:  Cristina Bailey    MRN: [id number] DOB: 1955/11/30  06/23/2019  Cristina Bailey was observed post Covid-19 immunization for 15 minutes without incident. She was provided with Vaccine Information Sheet and instruction to access the V-Safe system.   Cristina Bailey was instructed to call 911 with any severe reactions post vaccine: Marland Kitchen Difficulty breathing  . Swelling of face and throat  . A fast heartbeat  . A bad rash all over body  . Dizziness and weakness   Immunizations Administered    Name Date Dose VIS Date Route   Pfizer COVID-19 Vaccine 06/23/2019  9:00 AM 0.3 mL 04/18/2018 Intramuscular   Manufacturer: [employer], [employer]   Lot: [id number]   NDC: [address]-7

## 2019-07-24 ENCOUNTER — Ambulatory Visit: Payer: 59 | Attending: Internal Medicine

## 2019-07-24 DIAGNOSIS — Z23 Encounter for immunization: Secondary | ICD-10-CM

## 2019-07-24 NOTE — Progress Notes (Signed)
   Covid-19 Vaccination Clinic  Name:  Cristina Bailey    MRN: [id number] DOB: 01-22-1956  07/24/2019  Cristina Bailey was observed post Covid-19 immunization for 15 minutes without incident. She was provided with Vaccine Information Sheet and instruction to access the V-Safe system.   Cristina Bailey was instructed to call 911 with any severe reactions post vaccine: Marland Kitchen Difficulty breathing  . Swelling of face and throat  . A fast heartbeat  . A bad rash all over body  . Dizziness and weakness   Immunizations Administered    Name Date Dose VIS Date Route   Pfizer COVID-19 Vaccine 07/24/2019  9:01 AM 0.3 mL 04/18/2018 Intramuscular   Manufacturer: [employer], [employer]   Lot: [id number]   NDC: [address]-6

## 2019-09-20 ENCOUNTER — Other Ambulatory Visit: Payer: Self-pay | Admitting: Obstetrics and Gynecology

## 2019-09-20 DIAGNOSIS — Z1231 Encounter for screening mammogram for malignant neoplasm of breast: Secondary | ICD-10-CM

## 2019-10-22 ENCOUNTER — Other Ambulatory Visit: Payer: Self-pay

## 2019-10-22 ENCOUNTER — Ambulatory Visit
Admission: RE | Admit: 2019-10-22 | Discharge: 2019-10-22 | Disposition: A | Discharge status: Home / Self Care | Payer: 59 | Source: Ambulatory Visit | Attending: Obstetrics and Gynecology | Admitting: Obstetrics and Gynecology

## 2019-10-22 DIAGNOSIS — Z1231 Encounter for screening mammogram for malignant neoplasm of breast: Secondary | ICD-10-CM

## 2020-04-14 ENCOUNTER — Other Ambulatory Visit: Payer: Self-pay | Admitting: Adult Health

## 2020-04-14 DIAGNOSIS — D709 Neutropenia, unspecified: Secondary | ICD-10-CM

## 2020-04-17 ENCOUNTER — Telehealth: Payer: Self-pay

## 2020-04-17 NOTE — Telephone Encounter (Signed)
RN attempted call back, no answer.  No voicemail.

## 2020-09-30 ENCOUNTER — Other Ambulatory Visit: Payer: Self-pay | Admitting: Family Medicine

## 2020-09-30 DIAGNOSIS — Z1231 Encounter for screening mammogram for malignant neoplasm of breast: Secondary | ICD-10-CM

## 2020-10-19 ENCOUNTER — Encounter (HOSPITAL_BASED_OUTPATIENT_CLINIC_OR_DEPARTMENT_OTHER): Payer: Self-pay | Admitting: Emergency Medicine

## 2020-10-19 ENCOUNTER — Emergency Department (HOSPITAL_BASED_OUTPATIENT_CLINIC_OR_DEPARTMENT_OTHER): Payer: Medicare Other | Admitting: Radiology

## 2020-10-19 ENCOUNTER — Emergency Department (HOSPITAL_BASED_OUTPATIENT_CLINIC_OR_DEPARTMENT_OTHER)
Admission: EM | Admit: 2020-10-19 | Discharge: 2020-10-19 | Disposition: A | Discharge status: Home / Self Care | Payer: Medicare Other | Attending: Emergency Medicine | Admitting: Emergency Medicine

## 2020-10-19 ENCOUNTER — Other Ambulatory Visit: Payer: Self-pay

## 2020-10-19 DIAGNOSIS — M5441 Lumbago with sciatica, right side: Secondary | ICD-10-CM | POA: Diagnosis not present

## 2020-10-19 DIAGNOSIS — M545 Low back pain, unspecified: Secondary | ICD-10-CM | POA: Diagnosis present

## 2020-10-19 MED ORDER — KETOROLAC TROMETHAMINE 60 MG/2ML IM SOLN
30.0000 mg | Freq: Once | INTRAMUSCULAR | Status: AC
  Administered 2020-10-19: 30 mg via INTRAMUSCULAR
  Filled 2020-10-19: qty 2

## 2020-10-19 NOTE — Discharge Instructions (Addendum)
Continue to treat pain with ibuprofen and Tylenol.  Continue to move within the limits of your pain.  Follow-up with your primary care doctor regarding any further testing or ongoing management.

## 2020-10-19 NOTE — ED Triage Notes (Signed)
Right lower back and right hip pain started yesterday. Radiating down to the right leg at times.

## 2020-10-19 NOTE — ED Provider Notes (Signed)
[facility] EMERGENCY DEPT Provider Note   CSN: [id number] Arrival date & time: 10/19/20  0941     History Chief Complaint  Patient presents with   Leg Pain    Cristina Bailey is a 65 y.o. female.   Leg Pain Associated symptoms: back pain   Associated symptoms: no fatigue, no fever and no neck pain   Patient presents for right lower back and right hip pain.  Pain radiates down the posterior aspect of her right leg.  Onset of symptoms was yesterday.  Patient denies any straining or injuries.  She does stay active and has been walking.  Recently, she has increased the duration and intensity of her walking.  Yesterday, onset the pain was gradual.  She denies any areas of numbness or weakness.  She has been able to ambulate.  Ambulation does cause some worsening of her symptoms.  She has not had any difficulty with urination or stool.  She has not had any fevers or chills.  Today, she has not taken anything for analgesia.  Yesterday she did take Aleve for her symptoms.    Past Medical History:  Diagnosis Date   Anemia    Herpes     Patient Active Problem List   Diagnosis Date Noted   Genital herpes 08/29/2017   Neutropenia (HCC) 04/10/2017    Past Surgical History:  Procedure Laterality Date   NECK SURGERY       OB History   No obstetric history on file.     Family History  Problem Relation Age of Onset   Cancer Mother    Hypertension Mother    Cancer Brother     Social History   Tobacco Use   Smoking status: Never   Smokeless tobacco: Never  Vaping Use   Vaping Use: Never used  Substance Use Topics   Alcohol use: Never   Drug use: No    Home Medications Prior to Admission medications   Medication Sig Start Date End Date Taking? Authorizing Provider  sertraline (ZOLOFT) 50 MG tablet Take 50 mg by mouth daily. 08/10/17   [provider]  valACYclovir (VALTREX) 1000 MG tablet Take 1 tablet (1,000 mg total) by mouth daily. 08/29/17    Loa Socks, NP    Allergies    Sulfa antibiotics  Review of Systems   Review of Systems  Constitutional:  Negative for activity change, appetite change, chills, fatigue and fever.  HENT:  Negative for ear pain and sore throat.   Eyes:  Negative for pain and visual disturbance.  Respiratory:  Negative for cough and shortness of breath.   Cardiovascular:  Negative for chest pain and palpitations.  Gastrointestinal:  Negative for abdominal pain, nausea and vomiting.  Genitourinary:  Negative for dysuria, flank pain, hematuria and pelvic pain.  Musculoskeletal:  Positive for back pain. Negative for joint swelling, myalgias and neck pain.  Skin:  Negative for color change and rash.  Neurological:  Negative for dizziness, seizures, syncope, weakness and numbness.  All other systems reviewed and are negative.  Physical Exam Updated Vital Signs BP 122/64   Pulse 70   Temp 97.8 F (36.6 C) (Oral)   Resp 16   SpO2 100%   Physical Exam Vitals and nursing note reviewed.  Constitutional:      General: She is not in acute distress.    Appearance: Normal appearance. She is well-developed and normal weight. She is not ill-appearing, toxic-appearing or diaphoretic.  HENT:  Head: Normocephalic and atraumatic.     Right Ear: External ear normal.     Left Ear: External ear normal.     Nose: Nose normal.     Mouth/Throat:     Mouth: Mucous membranes are moist.     Pharynx: Oropharynx is clear.  Eyes:     Conjunctiva/sclera: Conjunctivae normal.  Cardiovascular:     Rate and Rhythm: Normal rate and regular rhythm.     Heart sounds: No murmur heard. Pulmonary:     Effort: Pulmonary effort is normal. No respiratory distress.     Breath sounds: Normal breath sounds.  Abdominal:     Palpations: Abdomen is soft.     Tenderness: There is no abdominal tenderness.  Musculoskeletal:        General: Tenderness (Mild tenderness to right side of sacrum) present. No swelling,  deformity or signs of injury. Normal range of motion.     Cervical back: Neck supple.     Right lower leg: No edema.     Left lower leg: No edema.     Comments: Positive straight leg raise test on right (ipsilateral) side.  Negative on contralateral side.  Skin:    General: Skin is warm and dry.     Capillary Refill: Capillary refill takes less than 2 seconds.  Neurological:     General: No focal deficit present.     Mental Status: She is alert and oriented to person, place, and time.     Cranial Nerves: No cranial nerve deficit.     Sensory: Sensation is intact. No sensory deficit.     Motor: Motor function is intact. No weakness or abnormal muscle tone.     Coordination: Coordination is intact.  Psychiatric:        Mood and Affect: Mood normal.        Behavior: Behavior normal.        Thought Content: Thought content normal.        Judgment: Judgment normal.    ED Results / Procedures / Treatments   Labs (all labs ordered are listed, but only abnormal results are displayed) Labs Reviewed - No data to display  EKG None  Radiology DG Lumbar Spine 2-3 Views  Result Date: 10/19/2020 CLINICAL DATA:  Right lower back pain EXAM: LUMBAR SPINE - 2-3 VIEW COMPARISON:  03/09/2010 FINDINGS: There is a curvature of the lumbar spine which is convex towards the left. The vertebral body heights are well preserved. There is moderate disc space narrowing and endplate spurring at L5-S1. Mild endplate spurring is noted at the remaining lumbar spine levels. No acute fracture or dislocation. Moderate stool burden noted within the colon and rectum. IMPRESSION: 1. No acute findings. 2. Lumbar scoliosis. 3. L5-S1 degenerative disc disease. Electronically Signed   By: Signa Kell M.D.   On: 10/19/2020 11:25   DG Sacrum/Coccyx  Result Date: 10/19/2020 CLINICAL DATA:  Low back pain EXAM: SACRUM AND COCCYX - 2+ VIEW COMPARISON:  None. FINDINGS: There is no evidence of fracture or other focal bone  lesions. Degenerative disc disease and scoliosis noted within the imaged portions of the lumbar spine. Moderate stool burden identified within the rectum. IMPRESSION: 1. No acute findings. 2. Lumbar degenerative disc disease and scoliosis. Electronically Signed   By: Signa Kell M.D.   On: 10/19/2020 11:22    Procedures Procedures   Medications Ordered in ED Medications  ketorolac (TORADOL) injection 30 mg (30 mg Intramuscular Given 10/19/20 1058)    ED Course  I have reviewed the triage vital signs and the nursing notes.  Pertinent labs & imaging results that were available during my care of the patient were reviewed by me and considered in my medical decision making (see chart for details).    MDM Rules/Calculators/A&P                           Patient is a 65 year old female who presents for right-sided lower back pain with right-sided sciatica.  Onset of symptoms was yesterday.  She had no precipitating strain or injury.  She does state that she has increased her walking lately.  On arrival in the ED, patient has normal vital signs.  She is well-appearing.  She is able to ambulate in the ED room without difficulty.  There is some mild tenderness to the posterior aspect of her lower back/sacrum.  She has no distal numbness or weakness.  Straight leg raise test on right side does elicit some posterior proximal RLE pain.  Patient has not gotten any analgesia today.  Toradol was ordered in the ED.  X-ray imaging showed chronic degenerative changes only.  Patient was advised to continue NSAID therapy over the next couple days, to continue physical activity as tolerated, and to follow-up with her primary care doctor.  Patient was discharged in good condition.  Final Clinical Impression(s) / ED Diagnoses Final diagnoses:  Acute right-sided low back pain with right-sided sciatica    Rx / DC Orders ED Discharge Orders     None        Gloris Manchester, MD 10/20/20 [phone]

## 2020-10-19 NOTE — ED Notes (Signed)
Dr Dixon in room w/pt now. 

## 2020-11-20 ENCOUNTER — Other Ambulatory Visit: Payer: Self-pay

## 2020-11-20 ENCOUNTER — Ambulatory Visit
Admission: RE | Admit: 2020-11-20 | Discharge: 2020-11-20 | Disposition: A | Discharge status: Home / Self Care | Payer: Medicare Other | Source: Ambulatory Visit | Attending: Family Medicine | Admitting: Family Medicine

## 2020-11-20 DIAGNOSIS — Z1231 Encounter for screening mammogram for malignant neoplasm of breast: Secondary | ICD-10-CM

## 2020-11-27 ENCOUNTER — Encounter (HOSPITAL_BASED_OUTPATIENT_CLINIC_OR_DEPARTMENT_OTHER): Payer: Self-pay | Admitting: Emergency Medicine

## 2020-11-27 ENCOUNTER — Other Ambulatory Visit: Payer: Self-pay

## 2020-11-27 ENCOUNTER — Emergency Department (HOSPITAL_BASED_OUTPATIENT_CLINIC_OR_DEPARTMENT_OTHER)
Admission: EM | Admit: 2020-11-27 | Discharge: 2020-11-27 | Disposition: A | Discharge status: Home / Self Care | Payer: Medicare Other | Attending: Emergency Medicine | Admitting: Emergency Medicine

## 2020-11-27 DIAGNOSIS — R112 Nausea with vomiting, unspecified: Secondary | ICD-10-CM

## 2020-11-27 DIAGNOSIS — U071 COVID-19: Secondary | ICD-10-CM

## 2020-11-27 LAB — CBC WITH DIFFERENTIAL/PLATELET
Abs Immature Granulocytes: 0.01 10*3/uL (ref 0.00–0.07)
Basophils Absolute: 0 10*3/uL (ref 0.0–0.1)
Basophils Relative: 0 %
Eosinophils Absolute: 0 10*3/uL (ref 0.0–0.5)
Eosinophils Relative: 0 %
HCT: 46 % (ref 36.0–46.0)
Hemoglobin: 15.4 g/dL — ABNORMAL HIGH (ref 12.0–15.0)
Immature Granulocytes: 0 %
Lymphocytes Relative: 27 %
Lymphs Abs: 1.2 10*3/uL (ref 0.7–4.0)
MCH: 29.9 pg (ref 26.0–34.0)
MCHC: 33.5 g/dL (ref 30.0–36.0)
MCV: 89.3 fL (ref 80.0–100.0)
Monocytes Absolute: 0.7 10*3/uL (ref 0.1–1.0)
Monocytes Relative: 16 %
Neutro Abs: 2.4 10*3/uL (ref 1.7–7.7)
Neutrophils Relative %: 57 %
Platelets: 216 10*3/uL (ref 150–400)
RBC: 5.15 MIL/uL — ABNORMAL HIGH (ref 3.87–5.11)
RDW: 12.7 % (ref 11.5–15.5)
WBC: 4.2 10*3/uL (ref 4.0–10.5)
nRBC: 0 % (ref 0.0–0.2)

## 2020-11-27 LAB — URINALYSIS, ROUTINE W REFLEX MICROSCOPIC
Bilirubin Urine: NEGATIVE
Glucose, UA: NEGATIVE mg/dL
Hgb urine dipstick: NEGATIVE
Ketones, ur: 15 mg/dL — AB
Leukocytes,Ua: NEGATIVE
Nitrite: NEGATIVE
Protein, ur: NEGATIVE mg/dL
Specific Gravity, Urine: 1.012 (ref 1.005–1.030)
pH: 6 (ref 5.0–8.0)

## 2020-11-27 LAB — COMPREHENSIVE METABOLIC PANEL
ALT: 23 U/L (ref 0–44)
AST: 30 U/L (ref 15–41)
Albumin: 4 g/dL (ref 3.5–5.0)
Alkaline Phosphatase: 68 U/L (ref 38–126)
Anion gap: 15 (ref 5–15)
BUN: 15 mg/dL (ref 8–23)
CO2: 19 mmol/L — ABNORMAL LOW (ref 22–32)
Calcium: 8.9 mg/dL (ref 8.9–10.3)
Chloride: 90 mmol/L — ABNORMAL LOW (ref 98–111)
Creatinine, Ser: 0.78 mg/dL (ref 0.44–1.00)
GFR, Estimated: >60 mL/min (ref >60)
Glucose, Bld: 165 mg/dL — ABNORMAL HIGH (ref 70–99)
Potassium: 4 mmol/L (ref 3.5–5.1)
Sodium: 124 mmol/L — ABNORMAL LOW (ref 135–145)
Total Bilirubin: 1 mg/dL (ref 0.3–1.2)
Total Protein: 7.3 g/dL (ref 6.5–8.1)

## 2020-11-27 LAB — LIPASE, BLOOD: Lipase: 19 U/L (ref 11–51)

## 2020-11-27 MED ORDER — ONDANSETRON HCL 4 MG/2ML IJ SOLN
4.0000 mg | Freq: Once | INTRAMUSCULAR | Status: AC
  Administered 2020-11-27: 4 mg via INTRAVENOUS
  Filled 2020-11-27: qty 2

## 2020-11-27 MED ORDER — ONDANSETRON HCL 4 MG PO TABS: 4.0000 mg | ORAL_TABLET | Freq: Three times a day (TID) | ORAL | 0 refills | Status: AC | PRN

## 2020-11-27 MED ORDER — SODIUM CHLORIDE 0.9 % IV BOLUS
1000.0000 mL | Freq: Once | INTRAVENOUS | Status: AC
  Administered 2020-11-27: 1000 mL via INTRAVENOUS

## 2020-11-27 NOTE — ED Provider Notes (Signed)
[facility] EMERGENCY DEPT Provider Note   CSN: [id number] Arrival date & time: 11/27/20  1101     History Chief Complaint  Patient presents with   Emesis    Covid +    Cristina Bailey is a 65 y.o. female.  The history is provided by the patient and medical records. No language interpreter was used.  Emesis  65 year old female significant history of anemia, recently diagnosed COVID-positive 3 days ago here presents complaining of nausea and vomiting.  Patient report for the past 3 days she has had some dry cough, subjective fever, generalized fatigue, and was seen by her PCP several days ago.  She was tested positive for COVID-19 and was prescribed Paxlovid.  She took the medication as prescribed on the first day without any problem but yesterday while taking the medication an hour later she felt nauseous, vomiting and having some loose stools.  She vomited twice yesterday and once today thus prompting this ER visit.  She has been vaccinated for COVID-19.  She denies any significant abdominal pain or dysuria.  She does endorse some mild weakness and shortness of breath when she has to wear a mask.  She did report taking Tylenol at home for her fever.   Past Medical History:  Diagnosis Date   Anemia    Herpes     Patient Active Problem List   Diagnosis Date Noted   Genital herpes 08/29/2017   Neutropenia (HCC) 04/10/2017    Past Surgical History:  Procedure Laterality Date   NECK SURGERY       OB History   No obstetric history on file.     Family History  Problem Relation Age of Onset   Breast cancer Mother    Cancer Mother    Hypertension Mother    Cancer Brother     Social History   Tobacco Use   Smoking status: Never   Smokeless tobacco: Never  Vaping Use   Vaping Use: Never used  Substance Use Topics   Alcohol use: Never   Drug use: No    Home Medications Prior to Admission medications   Medication Sig Start Date End Date Taking?  Authorizing Provider  sertraline (ZOLOFT) 50 MG tablet Take 50 mg by mouth daily. 08/10/17   [provider]  valACYclovir (VALTREX) 1000 MG tablet Take 1 tablet (1,000 mg total) by mouth daily. 08/29/17   Loa Socks, NP    Allergies    Sulfa antibiotics  Review of Systems   Review of Systems  Gastrointestinal:  Positive for vomiting.  All other systems reviewed and are negative.  Physical Exam Updated Vital Signs BP (!) 150/110 (BP Location: Right Arm)   Pulse 79   Temp 98.3 F (36.8 C) (Oral)   Resp (!) 26   Ht 5\' 11"  (1.803 m)   Wt 68 kg   SpO2 97%   BMI 20.92 kg/m   Physical Exam Vitals and nursing note reviewed.  Constitutional:      General: She is not in acute distress.    Appearance: She is well-developed.     Comments: Patient overall well-appearing  HENT:     Head: Atraumatic.  Eyes:     Conjunctiva/sclera: Conjunctivae normal.  Cardiovascular:     Rate and Rhythm: Normal rate and regular rhythm.     Pulses: Normal pulses.     Heart sounds: Normal heart sounds.  Pulmonary:     Effort: Pulmonary effort is normal.     Breath  sounds: No wheezing, rhonchi or rales.  Abdominal:     Palpations: Abdomen is soft.     Tenderness: There is no abdominal tenderness.  Musculoskeletal:     Cervical back: Neck supple.  Skin:    Findings: No rash.  Neurological:     Mental Status: She is alert.  Psychiatric:        Mood and Affect: Mood normal.    ED Results / Procedures / Treatments   Labs (all labs ordered are listed, but only abnormal results are displayed) Labs Reviewed  CBC WITH DIFFERENTIAL/PLATELET - Abnormal; Notable for the following components:      Result Value   RBC 5.15 (*)    Hemoglobin 15.4 (*)    All other components within normal limits  COMPREHENSIVE METABOLIC PANEL - Abnormal; Notable for the following components:   Sodium 124 (*)    Chloride 90 (*)    CO2 19 (*)    Glucose, Bld 165 (*)    All other components  within normal limits  URINALYSIS, ROUTINE W REFLEX MICROSCOPIC - Abnormal; Notable for the following components:   Ketones, ur 15 (*)    All other components within normal limits  LIPASE, BLOOD    EKG None  Radiology No results found.  Procedures Procedures   Medications Ordered in ED Medications - No data to display  ED Course  I have reviewed the triage vital signs and the nursing notes.  Pertinent labs & imaging results that were available during my care of the patient were reviewed by me and considered in my medical decision making (see chart for details).    MDM Rules/Calculators/A&P                           BP 119/68   Pulse 78   Temp 98 F (36.7 C) (Rectal)   Resp 17   Ht 5\' 11"  (1.803 m)   Wt 68 kg   SpO2 97%   BMI 20.92 kg/m   Final Clinical Impression(s) / ED Diagnoses Final diagnoses:  Nausea vomiting and diarrhea  COVID-19 virus infection    Rx / DC Orders ED Discharge Orders          Ordered    ondansetron (ZOFRAN) 4 MG tablet  Every 8 hours PRN        11/27/20 1630           12:26 PM Patient with COVID symptoms with past 3 days, positive COVID test at her PCP office several days prior and was started on Paxlovid.  She is here with nausea vomiting diarrhea shortly after she starts taking the medication.  I suspect this is likely more of a side effect from the medication.  No significant abdominal discomfort to suspect abdominal pathology.  Will provide symptomatic treatment.  She is well-appearing.  4:28 PM Labs remarkable for hyponatremia with a sodium of 124, hypochloremia with a chloride of 90 and a bicarb of 19.  Hemoglobin is 15.4 likely hemoconcentrated from a state of dehydration.  Patient did receive IV fluid as well as antinausea medication and felt much better.  She tolerates p.o.  At this time she is stable for discharge.  Symptoms not concerning for pneumonia.  Patient also can hold off on taking Paxlovid.     01/27/21,  PA-C 11/27/20 1632    01/27/21, MD 11/28/20 [phone]

## 2020-11-27 NOTE — ED Notes (Signed)
Pt given dc instructions and states understanding.

## 2020-11-27 NOTE — Discharge Instructions (Addendum)
You have symptoms likely due to a side effect of Paxlovid.  You may discontinue this medication.  Follow instruction below.  Take Zofran as needed for nausea.  Return if you have any concern.  Recommendations for at home COVID-19 symptoms management:  Please continue isolation at home. Call [phone] to see whether you might be eligible for therapeutic antibody infusions (leave your name and they will call you back).  If have acute worsening of symptoms please go to ER/urgent care for further evaluation. Check pulse oximetry and if below 90-92% please go to ER. The following supplements MAY help:  Vitamin C 500mg  twice a day and Quercetin 250-500 mg twice a day Vitamin D3 2000 - 4000 u/day B Complex vitamins Zinc 75-100 mg/day Melatonin 6-10 mg at night (the optimal dose is unknown) Aspirin 81mg /day (if no history of bleeding issues)

## 2020-11-27 NOTE — ED Triage Notes (Signed)
Covid + x 3 days , NVD. Weakness today

## 2021-09-29 ENCOUNTER — Other Ambulatory Visit: Payer: Self-pay | Admitting: Family Medicine

## 2021-09-29 DIAGNOSIS — Z1231 Encounter for screening mammogram for malignant neoplasm of breast: Secondary | ICD-10-CM

## 2021-11-23 ENCOUNTER — Ambulatory Visit
Admission: RE | Admit: 2021-11-23 | Discharge: 2021-11-23 | Disposition: A | Discharge status: Home / Self Care | Payer: Medicare Other | Source: Ambulatory Visit | Attending: Family Medicine | Admitting: Family Medicine

## 2021-11-23 DIAGNOSIS — Z1231 Encounter for screening mammogram for malignant neoplasm of breast: Secondary | ICD-10-CM

## 2022-07-13 ENCOUNTER — Ambulatory Visit
Admission: RE | Admit: 2022-07-13 | Discharge: 2022-07-13 | Disposition: A | Discharge status: Home / Self Care | Payer: Medicare Other | Source: Ambulatory Visit | Attending: Family Medicine | Admitting: Family Medicine

## 2022-07-13 ENCOUNTER — Other Ambulatory Visit: Payer: Self-pay | Admitting: Family Medicine

## 2022-07-13 DIAGNOSIS — M5459 Other low back pain: Secondary | ICD-10-CM

## 2022-12-10 ENCOUNTER — Other Ambulatory Visit: Payer: Self-pay | Admitting: Family Medicine

## 2022-12-10 DIAGNOSIS — Z1231 Encounter for screening mammogram for malignant neoplasm of breast: Secondary | ICD-10-CM

## 2023-01-04 ENCOUNTER — Ambulatory Visit
Admission: RE | Admit: 2023-01-04 | Discharge: 2023-01-04 | Disposition: A | Discharge status: Home / Self Care | Payer: Medicare Other | Source: Ambulatory Visit | Attending: Family Medicine | Admitting: Family Medicine

## 2023-01-04 DIAGNOSIS — Z1231 Encounter for screening mammogram for malignant neoplasm of breast: Secondary | ICD-10-CM

## 2023-08-02 ENCOUNTER — Other Ambulatory Visit: Payer: Self-pay | Admitting: Family Medicine

## 2023-08-02 DIAGNOSIS — C7410 Malignant neoplasm of medulla of unspecified adrenal gland: Secondary | ICD-10-CM

## 2023-08-06 ENCOUNTER — Ambulatory Visit
Admission: RE | Admit: 2023-08-06 | Discharge: 2023-08-06 | Disposition: A | Discharge status: Home / Self Care | Source: Ambulatory Visit | Attending: Family Medicine | Admitting: Family Medicine

## 2023-08-06 DIAGNOSIS — C7410 Malignant neoplasm of medulla of unspecified adrenal gland: Secondary | ICD-10-CM

## 2023-12-09 ENCOUNTER — Other Ambulatory Visit: Payer: Self-pay | Admitting: Family Medicine

## 2023-12-09 DIAGNOSIS — Z1231 Encounter for screening mammogram for malignant neoplasm of breast: Secondary | ICD-10-CM

## 2024-01-09 ENCOUNTER — Ambulatory Visit
Admission: RE | Admit: 2024-01-09 | Discharge: 2024-01-09 | Disposition: A | Discharge status: Home / Self Care | Source: Ambulatory Visit | Attending: Family Medicine | Admitting: Family Medicine

## 2024-01-09 DIAGNOSIS — Z1231 Encounter for screening mammogram for malignant neoplasm of breast: Secondary | ICD-10-CM
